# Patient Record
Sex: Female | Born: 1995 | Race: Black or African American | Hispanic: No | Marital: Single | Smoking: Never smoker
Health system: Southern US, Community
[De-identification: ages and names within clinical notes are randomized; demographics above are authoritative.]

## PROBLEM LIST (undated history)

## (undated) DIAGNOSIS — Z789 Other specified health status: Secondary | ICD-10-CM

## (undated) DIAGNOSIS — I10 Essential (primary) hypertension: Secondary | ICD-10-CM

## (undated) HISTORY — DX: Other specified health status: Z78.9

## (undated) HISTORY — PX: NO PAST SURGERIES: SHX2092

---

## 2018-10-12 ENCOUNTER — Ambulatory Visit (HOSPITAL_COMMUNITY)
Admission: EM | Admit: 2018-10-12 | Discharge: 2018-10-12 | Disposition: A | Discharge status: Home / Self Care | Payer: Managed Care, Other (non HMO) | Attending: Emergency Medicine | Admitting: Emergency Medicine

## 2018-10-12 ENCOUNTER — Encounter (HOSPITAL_COMMUNITY): Payer: Self-pay | Admitting: Emergency Medicine

## 2018-10-12 ENCOUNTER — Other Ambulatory Visit: Payer: Self-pay

## 2018-10-12 DIAGNOSIS — Z20822 Contact with and (suspected) exposure to covid-19: Secondary | ICD-10-CM

## 2018-10-12 DIAGNOSIS — Z20828 Contact with and (suspected) exposure to other viral communicable diseases: Secondary | ICD-10-CM | POA: Insufficient documentation

## 2018-10-12 DIAGNOSIS — R1110 Vomiting, unspecified: Secondary | ICD-10-CM | POA: Diagnosis not present

## 2018-10-12 DIAGNOSIS — R197 Diarrhea, unspecified: Secondary | ICD-10-CM

## 2018-10-12 DIAGNOSIS — J029 Acute pharyngitis, unspecified: Secondary | ICD-10-CM | POA: Insufficient documentation

## 2018-10-12 LAB — POCT RAPID STREP A: Streptococcus, Group A Screen (Direct): NEGATIVE

## 2018-10-12 NOTE — ED Provider Notes (Signed)
[facility]    CSN: [id number] Arrival date & time: 10/12/18  1150      History   Chief Complaint Chief Complaint  Patient presents with  . Diarrhea  . Emesis  . Sore Throat    HPI Veronica Townsend is a [age] y.o. female.   Patient presents with sore throat x1 week which she reports is getting better.  She reports intermittent vomiting and diarrhea x1 month.  She reports 4 episodes of diarrhea today.  She denies fever, chills, difficulty swallowing, cough, shortness of breath, rash, or other symptoms.  LMP: 10/04/2018.  The history is provided by the patient.    History reviewed. No pertinent past medical history.  There are no active problems to display for this patient.   History reviewed. No pertinent surgical history.  OB History   No obstetric history on file.      Home Medications    Prior to Admission medications   Not on File    Family History History reviewed. No pertinent family history.  Social History Social History   Tobacco Use  . Smoking status: Never Smoker  . Smokeless tobacco: Never Used  Substance Use Topics  . Alcohol use: Yes  . Drug use: Yes    Types: Marijuana     Allergies   Patient has no known allergies.   Review of Systems Review of Systems  Constitutional: Negative for chills and fever.  HENT: Positive for sore throat. Negative for ear pain.   Eyes: Negative for pain and visual disturbance.  Respiratory: Negative for cough and shortness of breath.   Cardiovascular: Negative for chest pain and palpitations.  Gastrointestinal: Positive for diarrhea and vomiting. Negative for abdominal pain.  Genitourinary: Negative for dysuria and hematuria.  Musculoskeletal: Negative for arthralgias and back pain.  Skin: Negative for color change and rash.  Neurological: Negative for seizures and syncope.  All other systems reviewed and are negative.    Physical Exam Triage Vital Signs ED Triage Vitals  Enc Vitals Group      BP      Pulse      Resp      Temp      Temp src      SpO2      Weight      Height      Head Circumference      Peak Flow      Pain Score      Pain Loc      Pain Edu?      Excl. in GC?    No data found.  Updated Vital Signs BP (!) 143/78 (BP Location: Left Arm)   Pulse (!) 52   Temp 98.8 F (37.1 C) (Oral)   Resp 12   LMP 10/04/2018 (Exact Date)   SpO2 100%   Visual Acuity Right Eye Distance:   Left Eye Distance:   Bilateral Distance:    Right Eye Near:   Left Eye Near:    Bilateral Near:     Physical Exam Vitals signs and nursing note reviewed.  Constitutional:      General: She is not in acute distress.    Appearance: She is well-developed.  HENT:     Head: Normocephalic and atraumatic.     Right Ear: Tympanic membrane normal.     Left Ear: Tympanic membrane normal.     Nose: Nose normal.     Mouth/Throat:     Mouth: Mucous membranes are moist.  Pharynx: Oropharynx is clear.  Eyes:     Conjunctiva/sclera: Conjunctivae normal.  Neck:     Musculoskeletal: Neck supple.  Cardiovascular:     Rate and Rhythm: Normal rate and regular rhythm.     Heart sounds: No murmur.  Pulmonary:     Effort: Pulmonary effort is normal. No respiratory distress.     Breath sounds: Normal breath sounds.  Abdominal:     General: Bowel sounds are normal.     Palpations: Abdomen is soft.     Tenderness: There is no abdominal tenderness. There is no right CVA tenderness, left CVA tenderness, guarding or rebound.  Skin:    General: Skin is warm and dry.     Findings: No rash.  Neurological:     Mental Status: She is alert.      UC Treatments / Results  Labs (all labs ordered are listed, but only abnormal results are displayed) Labs Reviewed  NOVEL CORONAVIRUS, NAA (HOSP ORDER, SEND-OUT TO REF LAB; TAT 18-24 HRS)  CULTURE, GROUP A STREP [facility])  POCT RAPID STREP A    EKG   Radiology No results found.  Procedures Procedures (including critical care time)   Medications Ordered in UC Medications - No data to display  Initial Impression / Assessment and Plan / UC Course  I have reviewed the triage vital signs and the nursing notes.  Pertinent labs & imaging results that were available during my care of the patient were reviewed by me and considered in my medical decision making (see chart for details).    Sore throat, suspected COVID.  Rapid strep negative; culture pending.  COVID test performed here.  Instructed patient to self quarantine until her test results are back.  Instructed patient to go to the emergency department if she develops difficulty swallowing, high fever, shortness of breath, severe diarrhea, or other concerning symptoms.  Patient agrees with plan of care.       Final Clinical Impressions(s) / UC Diagnoses   Final diagnoses:  Sore throat  Suspected Covid-19 Virus Infection     Discharge Instructions     Your rapid strep test is negative.  A throat culture is pending; we will call you if it is positive requiring treatment.    Your COVID test is pending.  You should self quarantine until your test result is back and is negative.    Go to the emergency department if you develop difficulty swallowing, shortness of breath, high fever, severe diarrhea, or other concerning symptoms.         ED Prescriptions    None     Controlled Substance Prescriptions [place] Controlled Substance Registry consulted? Not Applicable   Sharion Balloon, NP 10/12/18 1415

## 2018-10-12 NOTE — Discharge Instructions (Addendum)
Your rapid strep test is negative.  A throat culture is pending; we will call you if it is positive requiring treatment.    Your COVID test is pending.  You should self quarantine until your test result is back and is negative.    Go to the emergency department if you develop difficulty swallowing, shortness of breath, high fever, severe diarrhea, or other concerning symptoms.

## 2018-10-12 NOTE — ED Triage Notes (Addendum)
Pt reports vomiting/dry heaves once a week x1 month and diarrhea once a week x1 month.  She reports new onset tonsillar swelling and soreness that started on Monday. She states the swelling has gone down, but the throat is still sore. She denies any fever, cough, body aches, loss of taste or smell.  She states her eating habits are "weird", but she states she was on a Keto diet for a while and stopped and this is when all of this started occurring.  Pt also reports travel to [place], [place] in the beginning of August and [place] at the beginning of this month.

## 2018-10-13 LAB — NOVEL CORONAVIRUS, NAA (HOSP ORDER, SEND-OUT TO REF LAB; TAT 18-24 HRS): SARS-CoV-2, NAA: NOT DETECTED

## 2018-10-15 LAB — CULTURE, GROUP A STREP (THRC)

## 2019-02-10 ENCOUNTER — Other Ambulatory Visit: Payer: Self-pay

## 2019-02-10 ENCOUNTER — Encounter (HOSPITAL_COMMUNITY): Payer: Self-pay

## 2019-02-10 ENCOUNTER — Ambulatory Visit (HOSPITAL_COMMUNITY)
Admission: EM | Admit: 2019-02-10 | Discharge: 2019-02-10 | Disposition: A | Discharge status: Home / Self Care | Payer: Managed Care, Other (non HMO) | Attending: Family Medicine | Admitting: Family Medicine

## 2019-02-10 DIAGNOSIS — J039 Acute tonsillitis, unspecified: Secondary | ICD-10-CM | POA: Diagnosis present

## 2019-02-10 DIAGNOSIS — N3000 Acute cystitis without hematuria: Secondary | ICD-10-CM | POA: Diagnosis present

## 2019-02-10 LAB — POCT URINALYSIS DIP (DEVICE)
Bilirubin Urine: NEGATIVE
Glucose, UA: NEGATIVE mg/dL
Hgb urine dipstick: NEGATIVE
Ketones, ur: NEGATIVE mg/dL
Nitrite: NEGATIVE
Protein, ur: 30 mg/dL — AB
Specific Gravity, Urine: 1.025 (ref 1.005–1.030)
Urobilinogen, UA: 0.2 mg/dL (ref 0.0–1.0)
pH: 7 (ref 5.0–8.0)

## 2019-02-10 LAB — POCT RAPID STREP A: Streptococcus, Group A Screen (Direct): NEGATIVE

## 2019-02-10 LAB — POCT INFECTIOUS MONO SCREEN: Mono Screen: NEGATIVE

## 2019-02-10 MED ORDER — CEPHALEXIN 500 MG PO CAPS: 500.0000 mg | ORAL_CAPSULE | Freq: Two times a day (BID) | ORAL | 0 refills | Status: DC

## 2019-02-10 NOTE — Discharge Instructions (Signed)
Drink plenty of water Take antibiotic 2 times a day Call or return if worse instead of better

## 2019-02-10 NOTE — ED Provider Notes (Signed)
[facility]    CSN: [id number] Arrival date & time: 02/10/19  1347      History   Chief Complaint Chief Complaint  Patient presents with  . Appointment    2.00  . Urinary Tract Infection    HPI Veronica Townsend is a 24 y.o. female.   HPI   Patient is here with 2 medical problems First she states that she has had burning with urination for about a week.  She states that it hurts at the end of urination.  No vaginal discharge.  No exudate or rash.  Mild frequency.  No nocturia.  No incontinence.  No hematuria.  Denies need for STD testing.  No flank pain, fever chills, nausea Patient also states that she believes she has tonsillitis.  Hurts to swallow.  Mild headache. Does not think she has been exposed to Covid. declines Covid testing.  Patient states she has had recurring tonsillitis.  Usually does not have strep.  Pain with swallowing but is keeping down fluids well.  History reviewed. No pertinent past medical history.  There are no problems to display for this patient.   History reviewed. No pertinent surgical history.  OB History   No obstetric history on file.      Home Medications    Prior to Admission medications   Medication Sig Start Date End Date Taking? Authorizing Provider  cephALEXin (KEFLEX) 500 MG capsule Take 1 capsule (500 mg total) by mouth 2 (two) times daily. 02/10/19   Eustace Moore, MD    Family History Family History  Problem Relation Age of Onset  . Diabetes Mother   . Hypertension Mother   . Hypertension Father     Social History Social History   Tobacco Use  . Smoking status: Never Smoker  . Smokeless tobacco: Never Used  Substance Use Topics  . Alcohol use: Yes    Comment: weekly  . Drug use: Yes    Types: Marijuana     Allergies   Patient has no known allergies.   Review of Systems Review of Systems  Constitutional: Positive for appetite change. Negative for chills, fatigue and fever.  HENT:  Positive for sore throat and trouble swallowing. Negative for congestion.   Gastrointestinal: Negative for nausea and vomiting.  Genitourinary: Positive for dysuria and frequency. Negative for flank pain and vaginal discharge.  Musculoskeletal: Negative for back pain.  Skin: Negative for rash.  Neurological: Positive for headaches.     Physical Exam Triage Vital Signs ED Triage Vitals  Enc Vitals Group     BP 02/10/19 1359 (!) 150/87     Pulse Rate 02/10/19 1359 72     Resp 02/10/19 1359 15     Temp 02/10/19 1359 98.8 F (37.1 C)     Temp Source 02/10/19 1359 Oral     SpO2 02/10/19 1359 100 %     Weight --      Height --      Head Circumference --      Peak Flow --      Pain Score 02/10/19 1357 9     Pain Loc --      Pain Edu? --      Excl. in GC? --    No data found.  Updated Vital Signs BP (!) 150/87 (BP Location: Right Arm)   Pulse 72   Temp 98.8 F (37.1 C) (Oral)   Resp 15   SpO2 100%      Physical Exam Constitutional:  General: She is not in acute distress.    Appearance: She is well-developed and normal weight. She is not ill-appearing.  HENT:     Head: Normocephalic and atraumatic.     Right Ear: Tympanic membrane and ear canal normal.     Left Ear: Tympanic membrane and ear canal normal.     Nose: Nose normal. No congestion.     Mouth/Throat:     Mouth: Mucous membranes are moist.     Pharynx: Posterior oropharyngeal erythema present.     Comments: Large tonsils, red, with exudate Eyes:     Conjunctiva/sclera: Conjunctivae normal.     Pupils: Pupils are equal, round, and reactive to light.  Neck:     Comments: Anterior and posterior cervical nodes Cardiovascular:     Rate and Rhythm: Normal rate.  Pulmonary:     Effort: Pulmonary effort is normal. No respiratory distress.  Abdominal:     General: There is no distension.     Palpations: Abdomen is soft.  Musculoskeletal:        General: Normal range of motion.     Cervical back: Normal  range of motion.  Lymphadenopathy:     Cervical: Cervical adenopathy present.  Skin:    General: Skin is warm and dry.  Neurological:     Mental Status: She is alert. Mental status is at baseline.  Psychiatric:        Mood and Affect: Mood normal.        Behavior: Behavior normal.      UC Treatments / Results  Labs (all labs ordered are listed, but only abnormal results are displayed) Labs Reviewed  POCT URINALYSIS DIP (DEVICE) - Abnormal; Notable for the following components:      Result Value   Protein, ur 30 (*)    Leukocytes,Ua TRACE (*)    All other components within normal limits  CULTURE, GROUP A STREP [facility])  URINE CULTURE  POCT INFECTIOUS MONO SCREEN  POCT INFECTIOUS MONO SCREEN  POCT RAPID STREP A  Urine is marginally positive.  Will send for culture Strep test is negative.  Culture is sent Mono test is negative  EKG   Radiology No results found.  Procedures Procedures (including critical care time)  Medications Ordered in UC Medications - No data to display  Initial Impression / Assessment and Plan / UC Course  I have reviewed the triage vital signs and the nursing notes.  Pertinent labs & imaging results that were available during my care of the patient were reviewed by me and considered in my medical decision making (see chart for details).     Viral URI Possible UTI Tests pending Refuses COVID Final Clinical Impressions(s) / UC Diagnoses   Final diagnoses:  Acute cystitis without hematuria  Acute tonsillitis, unspecified etiology     Discharge Instructions     Drink plenty of water Take antibiotic 2 times a day Call or return if worse instead of better   ED Prescriptions    Medication Sig Dispense Auth. Provider   cephALEXin (KEFLEX) 500 MG capsule Take 1 capsule (500 mg total) by mouth 2 (two) times daily. 10 capsule Raylene Everts, MD     PDMP not reviewed this encounter.   Raylene Everts, MD 02/10/19 [phone]

## 2019-02-10 NOTE — ED Triage Notes (Signed)
Patient presents to Urgent Care with complaints of irritation during urination since about a week ago. Patient reports she also believes she has tonsillitis, states it hurts to swallow.

## 2019-02-11 LAB — URINE CULTURE: Culture: <10000 — AB

## 2019-02-13 LAB — CULTURE, GROUP A STREP (THRC)

## 2019-02-25 ENCOUNTER — Encounter (HOSPITAL_COMMUNITY): Payer: Self-pay | Admitting: Emergency Medicine

## 2019-02-25 ENCOUNTER — Other Ambulatory Visit: Payer: Self-pay

## 2019-02-25 ENCOUNTER — Ambulatory Visit (HOSPITAL_COMMUNITY)
Admission: EM | Admit: 2019-02-25 | Discharge: 2019-02-25 | Disposition: A | Discharge status: Home / Self Care | Payer: Managed Care, Other (non HMO) | Attending: Family Medicine | Admitting: Family Medicine

## 2019-02-25 DIAGNOSIS — N898 Other specified noninflammatory disorders of vagina: Secondary | ICD-10-CM | POA: Insufficient documentation

## 2019-02-25 DIAGNOSIS — Z202 Contact with and (suspected) exposure to infections with a predominantly sexual mode of transmission: Secondary | ICD-10-CM | POA: Diagnosis present

## 2019-02-25 MED ORDER — DOXYCYCLINE HYCLATE 100 MG PO CAPS: 100.0000 mg | ORAL_CAPSULE | Freq: Two times a day (BID) | ORAL | 0 refills | Status: DC

## 2019-02-25 MED ORDER — METRONIDAZOLE 500 MG PO TABS: 500.0000 mg | ORAL_TABLET | Freq: Two times a day (BID) | ORAL | 0 refills | Status: DC

## 2019-02-25 NOTE — Discharge Instructions (Signed)
Please pick up your prescription for doxycycline 100 mg and begin taking twice daily for the next seven (7) days. This will treat chlamydia. Your other prescription will treat trichomonas.  Even though we have treated you today, we have sent testing for sexually transmitted infections. We will notify you of any positive results once they are received. If required, we will prescribe any medications you might need.  Please refrain from all sexual activity for at least the next seven days.

## 2019-02-25 NOTE — ED Provider Notes (Signed)
[place]   [id number] 02/25/19 Arrival Time: 1093  ASSESSMENT & PLAN:  1. Vaginal discharge   2. Exposure to STD       Discharge Instructions     Please pick up your prescription for doxycycline 100 mg and begin taking twice daily for the next seven (7) days. This will treat chlamydia. Your other prescription will treat trichomonas.  Even though we have treated you today, we have sent testing for sexually transmitted infections. We will notify you of any positive results once they are received. If required, we will prescribe any medications you might need.  Please refrain from all sexual activity for at least the next seven days.     Pending: Labs Reviewed  CERVICOVAGINAL ANCILLARY ONLY   Would not like HIV/RPR testing. Will notify of any positive results. Instructed to refrain from sexual activity for at least seven days.  Reviewed expectations re: course of current medical issues. Questions answered. Outlined signs and symptoms indicating need for more acute intervention. Patient verbalized understanding. After Visit Summary given.   SUBJECTIVE:  Veronica Townsend is a 24 y.o. female who presents with complaint of vaginal discharge. Onset abrupt. First noticed few d ago. No odor. No specific aggravating or alleviating factors reported. Denies: urinary frequency, dysuria and gross hematuria. Afebrile. No abdominal or pelvic pain. Normal PO intake wihout n/v. No genital rashes or lesions. Reports that a sexual partner reports testing positive for chlamydia and trich. "Don't think I have anything though". OTC treatment: none. History of STI: none reported.  Patient's last menstrual period was 01/27/2019.    OBJECTIVE:  Vitals:   02/25/19 1237  BP: 138/79  Pulse: 78  Resp: 18  Temp: 98.7 F (37.1 C)  TempSrc: Oral  SpO2: 100%     General appearance: alert, cooperative, appears stated age and no distress Throat: lips, mucosa, and tongue normal;  teeth and gums normal CV: RRR Lungs: CTAB Back: no CVA tenderness; FROM at waist Abdomen: soft, non-tender GU: deferred Skin: warm and dry Psychological: alert and cooperative; normal mood and affect.    Labs Reviewed  CERVICOVAGINAL ANCILLARY ONLY    No Known Allergies  History reviewed. No pertinent past medical history. Family History  Problem Relation Age of Onset  . Diabetes Mother   . Hypertension Mother   . Hypertension Father    Social History   Socioeconomic History  . Marital status: Single    Spouse name: Not on file  . Number of children: Not on file  . Years of education: Not on file  . Highest education level: Not on file  Occupational History  . Not on file  Tobacco Use  . Smoking status: Never Smoker  . Smokeless tobacco: Never Used  Substance and Sexual Activity  . Alcohol use: Yes    Comment: weekly  . Drug use: Yes    Types: Marijuana  . Sexual activity: Not on file  Other Topics Concern  . Not on file  Social History Narrative  . Not on file   Social Determinants of Health   Financial Resource Strain:   . Difficulty of Paying Living Expenses: Not on file  Food Insecurity:   . Worried About Charity fundraiser in the Last Year: Not on file  . Ran Out of Food in the Last Year: Not on file  Transportation Needs:   . Lack of Transportation (Medical): Not on file  . Lack of Transportation (Non-Medical): Not on file  Physical Activity:   .  Days of Exercise per Week: Not on file  . Minutes of Exercise per Session: Not on file  Stress:   . Feeling of Stress : Not on file  Social Connections:   . Frequency of Communication with Friends and Family: Not on file  . Frequency of Social Gatherings with Friends and Family: Not on file  . Attends Religious Services: Not on file  . Active Member of Clubs or Organizations: Not on file  . Attends Banker Meetings: Not on file  . Marital Status: Not on file  Intimate Partner Violence:    . Fear of Current or Ex-Partner: Not on file  . Emotionally Abused: Not on file  . Physically Abused: Not on file  . Sexually Abused: Not on file          Mardella Layman, MD 02/25/19 1321

## 2019-02-25 NOTE — ED Triage Notes (Signed)
Sexual partner reports testing positive for trich and chlamydia. Patient uncertain if she is having discharge or is it related to starting period soon.  Low abdominal cramping

## 2019-02-27 LAB — CERVICOVAGINAL ANCILLARY ONLY
Bacterial vaginitis: POSITIVE — AB
Candida vaginitis: NEGATIVE
Chlamydia: NEGATIVE
Neisseria Gonorrhea: NEGATIVE
Trichomonas: NEGATIVE

## 2020-01-31 NOTE — L&D Delivery Note (Signed)
[facility] Delivery Note  Veronica Townsend is a 25 y.o. G1P0 s/p SVD at [redacted]w[redacted]d. She was admitted for IOL for cHTN (not on medications).   ROM: 12h 77m with clear fluid GBS Status: Positive Maximum Maternal Temperature: 99  Labor Progress: Presented for IOL, was closed and received 4 doses of cytotec, and then was started on pitocin and had AROM and progressed to complete.  Delivery Date/Time: [phone] at 01/16/2021 Delivery: Called to room and patient was complete and pushing. Head delivered LOA. No nuchal cord present. Once head was out the infant restituted however the anterior shoulder did not deliver and a shoulder dystocia was recognized. At that time McRoberts maneuver, suprapubic pressure was applied as well as the posterior arm was delivered by Dr. Ephriam Jenkins and the shoulder dystocia was resolved at 45 seconds. After that the body delivered in usual fashion. Infant was a bit stunned but with stimulation had spontaneous cry, placed on mother's abdomen, dried. Cord clamped x 2 after 1-minute delay, and cut by grandmother of baby. Cord blood drawn. Placenta delivered spontaneously with gentle cord traction. Due to prolonged induction with prolonged pitocin administration, patient was given TXA at delivery. Fundus was found to be boggy and she was given Methergine (blood pressure confirmed to be normotensive prior to administration) and a manual sweep was done, and fundus was found to be firming up. At that time Labia, perineum, vagina, and cervix inspected and found to have a first degree laceration with a left labial extension which was repaired with 3-0 vicryl. As well as the left labial extension was repaired with two interrupted stitched. The laceration was found to be hemostatic after repair.   Placenta: intact, 3V cord, to L&D Complications: 45 second shoulder dystocia Lacerations: 1st degree perineal, repaired for hemostasis purposes. EBL: 450cc Analgesia: epidural, local lidocaine for  part of repair.   Infant: female   APGARs 69,9   3220g  [name], MD, MPH OB Fellow, [facility], [facility] 01/16/2021, 10:24 AM

## 2020-05-12 DIAGNOSIS — Z3201 Encounter for pregnancy test, result positive: Secondary | ICD-10-CM | POA: Diagnosis not present

## 2020-05-25 ENCOUNTER — Inpatient Hospital Stay (HOSPITAL_COMMUNITY): Payer: Medicaid Other

## 2020-05-25 ENCOUNTER — Encounter (HOSPITAL_COMMUNITY): Payer: Self-pay

## 2020-05-25 ENCOUNTER — Encounter (HOSPITAL_COMMUNITY): Payer: Self-pay | Admitting: Obstetrics and Gynecology

## 2020-05-25 ENCOUNTER — Other Ambulatory Visit: Payer: Self-pay

## 2020-05-25 ENCOUNTER — Ambulatory Visit (HOSPITAL_COMMUNITY)
Admission: EM | Admit: 2020-05-25 | Discharge: 2020-05-25 | Disposition: A | Discharge status: Home / Self Care | Payer: Medicaid Other | Attending: Student | Admitting: Student

## 2020-05-25 ENCOUNTER — Inpatient Hospital Stay (HOSPITAL_COMMUNITY)
Admission: AD | Admit: 2020-05-25 | Discharge: 2020-05-25 | Disposition: A | Discharge status: Home / Self Care | Payer: Medicaid Other | Attending: Obstetrics and Gynecology | Admitting: Obstetrics and Gynecology

## 2020-05-25 DIAGNOSIS — O26851 Spotting complicating pregnancy, first trimester: Secondary | ICD-10-CM | POA: Diagnosis not present

## 2020-05-25 DIAGNOSIS — O26891 Other specified pregnancy related conditions, first trimester: Secondary | ICD-10-CM

## 2020-05-25 DIAGNOSIS — O23591 Infection of other part of genital tract in pregnancy, first trimester: Secondary | ICD-10-CM

## 2020-05-25 DIAGNOSIS — R070 Pain in throat: Secondary | ICD-10-CM | POA: Diagnosis not present

## 2020-05-25 DIAGNOSIS — Z3A08 8 weeks gestation of pregnancy: Secondary | ICD-10-CM | POA: Diagnosis not present

## 2020-05-25 DIAGNOSIS — J020 Streptococcal pharyngitis: Secondary | ICD-10-CM | POA: Diagnosis not present

## 2020-05-25 DIAGNOSIS — Z3A11 11 weeks gestation of pregnancy: Secondary | ICD-10-CM | POA: Diagnosis not present

## 2020-05-25 DIAGNOSIS — B9689 Other specified bacterial agents as the cause of diseases classified elsewhere: Secondary | ICD-10-CM | POA: Insufficient documentation

## 2020-05-25 DIAGNOSIS — N760 Acute vaginitis: Secondary | ICD-10-CM

## 2020-05-25 DIAGNOSIS — R221 Localized swelling, mass and lump, neck: Secondary | ICD-10-CM | POA: Diagnosis not present

## 2020-05-25 DIAGNOSIS — R109 Unspecified abdominal pain: Secondary | ICD-10-CM | POA: Insufficient documentation

## 2020-05-25 DIAGNOSIS — O23592 Infection of other part of genital tract in pregnancy, second trimester: Secondary | ICD-10-CM | POA: Insufficient documentation

## 2020-05-25 DIAGNOSIS — R0602 Shortness of breath: Secondary | ICD-10-CM | POA: Diagnosis not present

## 2020-05-25 DIAGNOSIS — O4691 Antepartum hemorrhage, unspecified, first trimester: Secondary | ICD-10-CM

## 2020-05-25 DIAGNOSIS — O4690 Antepartum hemorrhage, unspecified, unspecified trimester: Secondary | ICD-10-CM

## 2020-05-25 DIAGNOSIS — O26859 Spotting complicating pregnancy, unspecified trimester: Secondary | ICD-10-CM

## 2020-05-25 DIAGNOSIS — O26899 Other specified pregnancy related conditions, unspecified trimester: Secondary | ICD-10-CM

## 2020-05-25 LAB — POCT URINALYSIS DIPSTICK, ED / UC
Bilirubin Urine: NEGATIVE
Glucose, UA: NEGATIVE mg/dL
Hgb urine dipstick: NEGATIVE
Ketones, ur: NEGATIVE mg/dL
Nitrite: NEGATIVE
Protein, ur: NEGATIVE mg/dL
Specific Gravity, Urine: 1.025 (ref 1.005–1.030)
Urobilinogen, UA: 1 mg/dL (ref 0.0–1.0)
pH: 7 (ref 5.0–8.0)

## 2020-05-25 LAB — HIV ANTIBODY (ROUTINE TESTING W REFLEX): HIV Screen 4th Generation wRfx: NONREACTIVE

## 2020-05-25 LAB — ABO/RH: ABO/RH(D): AB POS

## 2020-05-25 LAB — CBC
HCT: 36.2 % (ref 36.0–46.0)
Hemoglobin: 11.6 g/dL — ABNORMAL LOW (ref 12.0–15.0)
MCH: 27 pg (ref 26.0–34.0)
MCHC: 32 g/dL (ref 30.0–36.0)
MCV: 84.2 fL (ref 80.0–100.0)
Platelets: 251 10*3/uL (ref 150–400)
RBC: 4.3 MIL/uL (ref 3.87–5.11)
RDW: 13 % (ref 11.5–15.5)
WBC: 16 10*3/uL — ABNORMAL HIGH (ref 4.0–10.5)
nRBC: 0 % (ref 0.0–0.2)

## 2020-05-25 LAB — HCG, QUANTITATIVE, PREGNANCY: hCG, Beta Chain, Quant, S: 5763 m[IU]/mL — ABNORMAL HIGH (ref <5)

## 2020-05-25 LAB — COMPREHENSIVE METABOLIC PANEL
ALT: 19 U/L (ref 0–44)
AST: 19 U/L (ref 15–41)
Albumin: 3.5 g/dL (ref 3.5–5.0)
Alkaline Phosphatase: 65 U/L (ref 38–126)
Anion gap: 4 — ABNORMAL LOW (ref 5–15)
BUN: 8 mg/dL (ref 6–20)
CO2: 27 mmol/L (ref 22–32)
Calcium: 8.9 mg/dL (ref 8.9–10.3)
Chloride: 104 mmol/L (ref 98–111)
Creatinine, Ser: 0.59 mg/dL (ref 0.44–1.00)
GFR, Estimated: >60 mL/min (ref >60)
Glucose, Bld: 88 mg/dL (ref 70–99)
Potassium: 3.9 mmol/L (ref 3.5–5.1)
Sodium: 135 mmol/L (ref 135–145)
Total Bilirubin: 0.5 mg/dL (ref 0.3–1.2)
Total Protein: 6.7 g/dL (ref 6.5–8.1)

## 2020-05-25 LAB — POCT PREGNANCY, URINE: Preg Test, Ur: POSITIVE — AB

## 2020-05-25 LAB — PROTEIN / CREATININE RATIO, URINE
Creatinine, Urine: 124.75 mg/dL
Protein Creatinine Ratio: 0.05 mg/mg{Cre} (ref 0.00–0.15)
Total Protein, Urine: 6 mg/dL

## 2020-05-25 LAB — WET PREP, GENITAL
Sperm: NONE SEEN
Trich, Wet Prep: NONE SEEN
Yeast Wet Prep HPF POC: NONE SEEN

## 2020-05-25 LAB — POCT RAPID STREP A, ED / UC: Streptococcus, Group A Screen (Direct): POSITIVE — AB

## 2020-05-25 MED ORDER — PROMETHAZINE HCL 12.5 MG PO TABS: 12.5000 mg | ORAL_TABLET | Freq: Four times a day (QID) | ORAL | 0 refills | Status: DC | PRN

## 2020-05-25 MED ORDER — METRONIDAZOLE 500 MG PO TABS: 500.0000 mg | ORAL_TABLET | Freq: Two times a day (BID) | ORAL | 0 refills | Status: DC

## 2020-05-25 MED ORDER — AMOXICILLIN 875 MG PO TABS: 875.0000 mg | ORAL_TABLET | Freq: Two times a day (BID) | ORAL | 0 refills | Status: AC

## 2020-05-25 NOTE — ED Triage Notes (Addendum)
Pt noticed swollen areas on both sides of neck since last night. These are sore and palpable by RN. Pt reports sensation of throat closing causing sensation of difficulty breathing (sat 97 at this time, remained 97-100% throughout triage) with sore throat. Reports also having body aches and fatigue.  Of note pt reports being [redacted] weeks pregnant. She adds that she had spotting 4 days ago and is experiencing lower abdominal cramping.   Reports falling down stairs 4 weeks ago. Denies injury.

## 2020-05-25 NOTE — ED Notes (Addendum)
Cheree Ditto, PA notified of pt's lower abdominal cramping in pregnancy as well as sensation of throat closing. States will go to see pt. Pt kept on pulse ox with door open.

## 2020-05-25 NOTE — MAU Provider Note (Addendum)
History     CSN: [id number]  Arrival date and time: 05/25/20 1244   Event Date/Time   First Provider Initiated Contact with Patient 05/25/20 1427      Chief Complaint  Patient presents with  . Abdominal Pain  . Possible Pregnancy   Ms. Veronica Townsend is a 25 y.o. year old G1P0 female at [redacted]w[redacted]d weeks gestation who presents to MAU reporting pinkish spotting off and on for 2 days; none today., fatigue, hips and legs "very sore," and pain/cramping in lower abdomen for 4 days. Her last unprotected SI was 3 weeks ago. She was seen at UC earlier today, dx'd with strep throat and Rx'd Amoxicillin 875 mg BID x 7 days. She was then "referred" to MAU for evaluation of abdominal pain pregnancy. She has not established OB care at this time, but "considering going to an OB on [employer]."    OB History    Gravida  1   Para      Term      Preterm      AB      Living        SAB      IAB      Ectopic      Multiple      Live Births              History reviewed. No pertinent past medical history.  History reviewed. No pertinent surgical history.  Family History  Problem Relation Age of Onset  . Diabetes Mother   . Hypertension Mother   . Hypertension Father     Social History   Tobacco Use  . Smoking status: Never Smoker  . Smokeless tobacco: Never Used  Vaping Use  . Vaping Use: Never used  Substance Use Topics  . Alcohol use: Not Currently    Comment: weekly  . Drug use: Not Currently    Types: Marijuana    Allergies: No Known Allergies  Medications Prior to Admission  Medication Sig Dispense Refill Last Dose  . amoxicillin (AMOXIL) 875 MG tablet Take 1 tablet (875 mg total) by mouth 2 (two) times daily for 7 days. 14 tablet 0     Review of Systems  Constitutional: Positive for fatigue.  HENT: Negative.   Eyes: Negative.   Respiratory: Negative.   Cardiovascular: Negative.   Gastrointestinal: Negative.   Endocrine: Negative.   Genitourinary:  Positive for pelvic pain (cramping) and vaginal bleeding (spotting).  Musculoskeletal: Negative.   Skin: Negative.   Allergic/Immunologic: Negative.   Neurological: Negative.   Hematological: Negative.   Psychiatric/Behavioral: Negative.    Physical Exam   Blood pressure (!) 150/81, pulse 76, temperature 98 F (36.7 C), temperature source Oral, resp. rate 18, height 5\' 10"  (1.778 m), weight 85.2 kg, last menstrual period 03/08/2020, SpO2 99 %.  Physical Exam Vitals and nursing note reviewed.  Constitutional:      Appearance: Normal appearance.  Genitourinary:    General: Normal vulva.     Comments: Pelvic exam: External genitalia normal, SE: vaginal walls pink and well rugated, cervix is smooth, pink, no lesions, moderate amt of thick, white vaginal d/c -- WP, GC/CT done, cervix visually closed, Uterus is non-tender, no CMT or friability, no adnexal tenderness.  Musculoskeletal:        General: Normal range of motion.  Skin:    General: Skin is warm and dry.  Neurological:     Mental Status: She is alert and oriented to person, place, and time.  Psychiatric:        Mood and Affect: Mood normal.        Behavior: Behavior normal.        Thought Content: Thought content normal.        Judgment: Judgment normal.     MAU Course  Procedures  MDM CCUA UPT CBC ABO/Rh HCG Wet Prep GC/CT -- pending HIV -- pending OB < 14 wks [place] with TV  Results for orders placed or performed during the hospital encounter of 05/25/20 (from the past 24 hour(s))  Pregnancy, urine POC     Status: Abnormal   Collection Time: 05/25/20  1:46 PM  Result Value Ref Range   Preg Test, Ur POSITIVE (A) NEGATIVE  Protein / creatinine ratio, urine     Status: None   Collection Time: 05/25/20  2:35 PM  Result Value Ref Range   Creatinine, Urine 124.75 mg/dL   Total Protein, Urine 6 mg/dL   Protein Creatinine Ratio 0.05 0.00 - 0.15 mg/mg[Cre]  Wet prep, genital     Status: Abnormal   Collection Time:  05/25/20  2:50 PM   Specimen: Vaginal  Result Value Ref Range   Yeast Wet Prep HPF POC NONE SEEN NONE SEEN   Trich, Wet Prep NONE SEEN NONE SEEN   Clue Cells Wet Prep HPF POC PRESENT (A) NONE SEEN   WBC, Wet Prep HPF POC MANY (A) NONE SEEN   Sperm NONE SEEN   ABO/Rh     Status: None   Collection Time: 05/25/20  3:03 PM  Result Value Ref Range   ABO/RH(D) AB POS    No rh immune globuloin      NOT A RH IMMUNE GLOBULIN CANDIDATE, PT RH POSITIVE Performed at [facility], [address], [place], [place] [address]   hCG, quantitative, pregnancy     Status: Abnormal   Collection Time: 05/25/20  3:03 PM  Result Value Ref Range   hCG, Beta Chain, Quant, S 5,763 (H) <5 mIU/mL  HIV Antibody (routine testing w rflx)     Status: None   Collection Time: 05/25/20  3:03 PM  Result Value Ref Range   HIV Screen 4th Generation wRfx Non Reactive Non Reactive  CBC     Status: Abnormal   Collection Time: 05/25/20  3:03 PM  Result Value Ref Range   WBC 16.0 (H) 4.0 - 10.5 K/uL   RBC 4.30 3.87 - 5.11 MIL/uL   Hemoglobin 11.6 (L) 12.0 - 15.0 g/dL   HCT 27.7 82.4 - 23.5 %   MCV 84.2 80.0 - 100.0 fL   MCH 27.0 26.0 - 34.0 pg   MCHC 32.0 30.0 - 36.0 g/dL   RDW 36.1 44.3 - 15.4 %   Platelets 251 150 - 400 K/uL   nRBC 0.0 0.0 - 0.2 %  Comprehensive metabolic panel     Status: Abnormal   Collection Time: 05/25/20  3:03 PM  Result Value Ref Range   Sodium 135 135 - 145 mmol/L   Potassium 3.9 3.5 - 5.1 mmol/L   Chloride 104 98 - 111 mmol/L   CO2 27 22 - 32 mmol/L   Glucose, Bld 88 70 - 99 mg/dL   BUN 8 6 - 20 mg/dL   Creatinine, Ser 0.08 0.44 - 1.00 mg/dL   Calcium 8.9 8.9 - 67.6 mg/dL   Total Protein 6.7 6.5 - 8.1 g/dL   Albumin 3.5 3.5 - 5.0 g/dL   AST 19 15 - 41 U/L  ALT 19 0 - 44 U/L   Alkaline Phosphatase 65 38 - 126 U/L   Total Bilirubin 0.5 0.3 - 1.2 mg/dL   GFR, Estimated >69 >48 mL/min   Anion gap 4 (L) 5 - 15    US OB LESS THAN 14 WEEKS WITH OB TRANSVAGINAL  Result Date:  05/25/2020 CLINICAL DATA:  Four days of vaginal spotting lower abdominal pain. Positive urine pregnancy test however no quantitative beta HCG performed at time of imaging. EXAM: OBSTETRIC <14 WK ULTRASOUND TECHNIQUE: Transabdominal ultrasound was performed for evaluation of the gestation as well as the maternal uterus and adnexal regions. COMPARISON:  None. FINDINGS: Intrauterine gestational sac: Single Yolk sac:  Visualized. Embryo:  Not Visualized. Cardiac Activity: Not Visualized. Heart Rate:  bpm MSD:  9 mm   5 w   4 d Subchorionic hemorrhage:  None visualized. Maternal uterus/adnexae: Small corpus luteum in the right ovary IMPRESSION: Single intrauterine pregnancy of uncertain viability, recommend follow-up ultrasound in 14 days.  Electronically Signed   By: Maudry Mayhew MD   On: 05/25/2020 16:30    Assessment and Plan  1. Spotting affecting pregnancy in first trimester  - US PELVIC COMPLETE WITH TRANSVAGINAL ordered for ~ 06/04/2020 for viability  2. Cramping affecting pregnancy, antepartum   3. Bacterial vaginosis - Rx for Flagyl 500 mg BID x 7 days - Information provided on BV - verbal explanation also given    4. [redacted] weeks gestation of pregnancy  - Discharge patient - Advised to call the OB provider of her choice and establish [facility] ASAP - Patient verbalized an understanding of the plan of care and agrees.   Raelyn Mora, CNM 05/25/2020, 2:28 PM

## 2020-05-25 NOTE — MAU Note (Signed)
4 days ago started having pinkish spotting, was off and on for 2 days, no longer seeing. Feeling fatigued. Hips and legs are very sore, for the last 2 wks. Pain in lower abd x4 days,cramping.  Was given rx at [facility] to treat strep throat

## 2020-05-25 NOTE — ED Provider Notes (Signed)
[facility]    CSN: [id number] Arrival date & time: 05/25/20  1109      History   Chief Complaint Chief Complaint  Patient presents with  . Neck swelling  . Oral Swelling  . Shortness of Breath  . Abdominal Pain  . throat tightness    HPI Veronica Townsend is a 25 y.o. female presenting with neck swelling, sore throat, shortness of breath, abdominal pain, vaginal spotting. She is [redacted] weeks pregnant.   Notes 1 day of sore throat, anterior cervical lymphadenopathy, sensation of throat closing, pain with swallowing, body aches, fatigue.  Also notes 4 days of intermittent lower abdominal cramping.  She had some light vaginal spotting for 2 days, though this seemed to resolve 2 days ago. Denies other urinary symptoms. Denies hematuria, dysuria, frequency, urgency, back pain, n/v/d. Denies changes in bowel or bladder function. Marland Kitchen   HPI  History reviewed. No pertinent past medical history.  There are no problems to display for this patient.   History reviewed. No pertinent surgical history.  OB History    Gravida  1   Para      Term      Preterm      AB      Living        SAB      IAB      Ectopic      Multiple      Live Births               Home Medications    Prior to Admission medications   Medication Sig Start Date End Date Taking? Authorizing Provider  amoxicillin (AMOXIL) 875 MG tablet Take 1 tablet (875 mg total) by mouth 2 (two) times daily for 7 days. 05/25/20 06/01/20 Yes Rhys Martini, PA-C    Family History Family History  Problem Relation Age of Onset  . Diabetes Mother   . Hypertension Mother   . Hypertension Father     Social History Social History   Tobacco Use  . Smoking status: Never Smoker  . Smokeless tobacco: Never Used  Vaping Use  . Vaping Use: Never used  Substance Use Topics  . Alcohol use: Not Currently    Comment: weekly  . Drug use: Not Currently    Types: Marijuana     Allergies   Patient has no  known allergies.   Review of Systems Review of Systems  Constitutional: Negative for appetite change, chills, diaphoresis, fever and unexpected weight change.  HENT: Positive for sore throat and trouble swallowing. Negative for congestion, ear pain, sinus pressure, sinus pain and sneezing.   Respiratory: Negative for cough, chest tightness and shortness of breath.   Cardiovascular: Negative for chest pain.  Gastrointestinal: Positive for abdominal pain. Negative for abdominal distention, anal bleeding, blood in stool, constipation, diarrhea, nausea, rectal pain and vomiting.  Genitourinary: Positive for vaginal bleeding. Negative for dysuria, flank pain, frequency, urgency, vaginal discharge and vaginal pain.  Musculoskeletal: Negative for back pain and myalgias.  Neurological: Negative for dizziness, light-headedness and headaches.  All other systems reviewed and are negative.    Physical Exam Triage Vital Signs ED Triage Vitals  Enc Vitals Group     BP 05/25/20 1206 135/78     Pulse Rate 05/25/20 1206 70     Resp 05/25/20 1206 18     Temp 05/25/20 1206 99.3 F (37.4 C)     Temp src --      SpO2 05/25/20 1206 99 %  Weight --      Height --      Head Circumference --      Peak Flow --      Pain Score 05/25/20 1202 6     Pain Loc --      Pain Edu? --      Excl. in GC? --    No data found.  Updated Vital Signs BP 135/78   Pulse 70   Temp 99.3 F (37.4 C)   Resp 18   LMP 03/08/2020 Comment: was spotting in march for 3 days  SpO2 99%   Visual Acuity Right Eye Distance:   Left Eye Distance:   Bilateral Distance:    Right Eye Near:   Left Eye Near:    Bilateral Near:     Physical Exam Vitals reviewed.  Constitutional:      General: She is not in acute distress.    Appearance: Normal appearance. She is not ill-appearing.  HENT:     Head: Normocephalic and atraumatic.     Right Ear: Hearing, tympanic membrane, ear canal and external ear normal. No swelling  or tenderness. There is no impacted cerumen. No mastoid tenderness. Tympanic membrane is not perforated, erythematous, retracted or bulging.     Left Ear: Hearing, tympanic membrane, ear canal and external ear normal. No swelling or tenderness. There is no impacted cerumen. No mastoid tenderness. Tympanic membrane is not perforated, erythematous, retracted or bulging.     Nose:     Right Sinus: No maxillary sinus tenderness or frontal sinus tenderness.     Left Sinus: No maxillary sinus tenderness or frontal sinus tenderness.     Mouth/Throat:     Mouth: Mucous membranes are moist.     Pharynx: Uvula midline. No oropharyngeal exudate or posterior oropharyngeal erythema.     Tonsils: Tonsillar exudate present. 2+ on the right. 2+ on the left.     Comments: Moist mucous membranes Smooth erythema posterior pharynx On exam she is tolerating her secretions without difficulty, there is no trismus, no drooling, she has normal phonation  Eyes:     Extraocular Movements: Extraocular movements intact.     Pupils: Pupils are equal, round, and reactive to light.  Cardiovascular:     Rate and Rhythm: Normal rate and regular rhythm.     Heart sounds: Normal heart sounds.  Pulmonary:     Effort: Pulmonary effort is normal.     Breath sounds: Normal breath sounds and air entry. No decreased breath sounds, wheezing, rhonchi or rales.  Chest:     Chest wall: No tenderness.  Abdominal:     General: Abdomen is flat. Bowel sounds are normal. There is no distension.     Palpations: Abdomen is soft. There is no mass.     Tenderness: There is abdominal tenderness in the right lower quadrant and left lower quadrant. There is no right CVA tenderness, left CVA tenderness, guarding or rebound. Negative signs include Murphy's sign, Rovsing's sign and McBurney's sign.  Genitourinary:    Comments: deferred Lymphadenopathy:     Cervical: Cervical adenopathy present.     Right cervical: Superficial cervical  adenopathy present.     Left cervical: Superficial cervical adenopathy present.  Skin:    General: Skin is warm.     Capillary Refill: Capillary refill takes less than 2 seconds.     Comments: Good skin turgor  Neurological:     General: No focal deficit present.     Mental Status: She is  alert and oriented to person, place, and time.  Psychiatric:        Attention and Perception: Attention and perception normal.        Mood and Affect: Mood and affect normal.        Behavior: Behavior normal. Behavior is cooperative.        Thought Content: Thought content normal.        Judgment: Judgment normal.      UC Treatments / Results  Labs (all labs ordered are listed, but only abnormal results are displayed) Labs Reviewed  POCT RAPID STREP A, ED / UC - Abnormal; Notable for the following components:      Result Value   Streptococcus, Group A Screen (Direct) POSITIVE (*)    All other components within normal limits  POCT URINALYSIS DIPSTICK, ED / UC - Abnormal; Notable for the following components:   Leukocytes,Ua SMALL (*)    All other components within normal limits  POC URINE PREG, ED    EKG   Radiology No results found.  Procedures Procedures (including critical care time)  Medications Ordered in UC Medications - No data to display  Initial Impression / Assessment and Plan / UC Course  I have reviewed the triage vital signs and the nursing notes.  Pertinent labs & imaging results that were available during my care of the patient were reviewed by me and considered in my medical decision making (see chart for details).     This patient is a 24 year old female presenting with strep pharyngitis and vaginal bleeding with some abdominal cramping.  She is [redacted] weeks pregnant. Rapid strep positive.  On exam, there is some mild tenderness to palpation in both the right and left lower quadrants.   Abdominal pain and vaginal spotting during pregnancy can indicate a miscarriage,  which is a medical emergency.  I am referring this patient to [facility] for further evaluation and management. she has not followed by OB/GYN yet.  She is in agreement with this plan, and states she will head straight there.  Hemodynamically stable for transfer to personal close time.  Final Clinical Impressions(s) / UC Diagnoses   Final diagnoses:  Vaginal bleeding in pregnancy  Streptococcal pharyngitis     Discharge Instructions     -Head straight to [facility] for further evaluation and management of abdominal pain with vaginal bleeding during pregnancy. -Amoxicillin twice daily for 7 days for your strep throat.    ED Prescriptions    Medication Sig Dispense Auth. Provider   amoxicillin (AMOXIL) 875 MG tablet Take 1 tablet (875 mg total) by mouth 2 (two) times daily for 7 days. 14 tablet Rhys Martini, PA-C     PDMP not reviewed this encounter.   Rhys Martini, PA-C 05/25/20 1351

## 2020-05-25 NOTE — Discharge Instructions (Addendum)
-  Head straight to [facility] for further evaluation and management of abdominal pain with vaginal bleeding during pregnancy. -Amoxicillin twice daily for 7 days for your strep throat.

## 2020-05-25 NOTE — Discharge Instructions (Signed)

## 2020-05-25 NOTE — ED Notes (Signed)
Patient is being discharged from the Urgent Care and sent to the MAU via POV . Per Ignacia Bayley, PA patient is in need of higher level of care due to lower abdominal cramping with positive pregnancy test. Patient is aware and verbalizes understanding of plan of care.  Vitals:   05/25/20 1206  BP: 135/78  Pulse: 70  Resp: 18  Temp: 99.3 F (37.4 C)  SpO2: 99%

## 2020-05-26 LAB — GC/CHLAMYDIA PROBE AMP (~~LOC~~) NOT AT ARMC
Chlamydia: POSITIVE — AB
Comment: NEGATIVE
Comment: NORMAL
Neisseria Gonorrhea: NEGATIVE

## 2020-06-09 ENCOUNTER — Other Ambulatory Visit: Payer: Self-pay

## 2020-06-09 ENCOUNTER — Telehealth: Payer: Self-pay | Admitting: Obstetrics and Gynecology

## 2020-06-09 ENCOUNTER — Ambulatory Visit (INDEPENDENT_AMBULATORY_CARE_PROVIDER_SITE_OTHER): Payer: Medicaid Other

## 2020-06-09 DIAGNOSIS — Z1331 Encounter for screening for depression: Secondary | ICD-10-CM

## 2020-06-09 MED ORDER — AZITHROMYCIN 250 MG PO TABS: 1000.0000 mg | ORAL_TABLET | Freq: Once | ORAL | 0 refills | Status: AC

## 2020-06-09 NOTE — Progress Notes (Signed)
Pt reports that she is hear today for tx.  Looking at pt's chart pt tested positive for chlamydia.  Pt reports that she is so confused about what she needs to do.  I explained to the patient that treatment of Azithromycin 1000 mg once has been sent to pharmacy and to make sure that her partner is treated as well.  I also advised to not have sex for two weeks after they both have been treated.  I explained to the pt that she will get tested again to make sure that it has been treated.  I informed pt that she has a NEW OB appt scheduled with [place] on June 7th so she can call us with concerns and questions.  Pt verbalized understanding with no further questions.   Leonette Nutting  06/09/20

## 2020-06-09 NOTE — Telephone Encounter (Signed)
Patient identification verified by DOB and address Notified of (+) CT results - patient aware of results and was "seen today at [facility] for it and just picked up Rx"  Raelyn Mora, [facility]  06/09/2020 6:54 PM

## 2020-07-06 ENCOUNTER — Encounter: Payer: Self-pay | Admitting: Family Medicine

## 2020-07-06 ENCOUNTER — Other Ambulatory Visit (HOSPITAL_COMMUNITY)
Admission: RE | Admit: 2020-07-06 | Discharge: 2020-07-06 | Disposition: A | Discharge status: Home / Self Care | Payer: PRIVATE HEALTH INSURANCE | Source: Ambulatory Visit | Attending: Family Medicine | Admitting: Family Medicine

## 2020-07-06 ENCOUNTER — Ambulatory Visit (INDEPENDENT_AMBULATORY_CARE_PROVIDER_SITE_OTHER): Payer: PRIVATE HEALTH INSURANCE | Admitting: Family Medicine

## 2020-07-06 ENCOUNTER — Other Ambulatory Visit: Payer: Self-pay

## 2020-07-06 VITALS — BP 132/63 | HR 80 | Wt 195.0 lb

## 2020-07-06 DIAGNOSIS — N898 Other specified noninflammatory disorders of vagina: Secondary | ICD-10-CM

## 2020-07-06 DIAGNOSIS — Z3481 Encounter for supervision of other normal pregnancy, first trimester: Secondary | ICD-10-CM | POA: Diagnosis not present

## 2020-07-06 DIAGNOSIS — Z124 Encounter for screening for malignant neoplasm of cervix: Secondary | ICD-10-CM | POA: Diagnosis not present

## 2020-07-06 DIAGNOSIS — Z3401 Encounter for supervision of normal first pregnancy, first trimester: Secondary | ICD-10-CM

## 2020-07-06 DIAGNOSIS — Z3400 Encounter for supervision of normal first pregnancy, unspecified trimester: Secondary | ICD-10-CM | POA: Insufficient documentation

## 2020-07-06 DIAGNOSIS — R030 Elevated blood-pressure reading, without diagnosis of hypertension: Secondary | ICD-10-CM

## 2020-07-06 DIAGNOSIS — O10919 Unspecified pre-existing hypertension complicating pregnancy, unspecified trimester: Secondary | ICD-10-CM | POA: Insufficient documentation

## 2020-07-06 DIAGNOSIS — O0990 Supervision of high risk pregnancy, unspecified, unspecified trimester: Secondary | ICD-10-CM | POA: Insufficient documentation

## 2020-07-06 MED ORDER — BLOOD PRESSURE KIT DEVI
1.0000 | 0 refills | Status: AC | PRN
Start: 2020-07-06 — End: ?

## 2020-07-06 NOTE — Patient Instructions (Signed)
 Second Trimester of Pregnancy  The second trimester of pregnancy is from week 13 through week 27. This is months 4 through 6 of pregnancy. The second trimester is often a time when you feel your best. Your body has adjusted to being pregnant, and you begin to feel better physically. During the second trimester:  Morning sickness has lessened or stopped completely.  You may have more energy.  You may have an increase in appetite. The second trimester is also a time when the unborn baby (fetus) is growing rapidly. At the end of the sixth month, the fetus may be up to 12 inches long and weigh about 1 pounds. You will likely begin to feel the baby move (quickening) between 16 and 20 weeks of pregnancy. Body changes during your second trimester Your body continues to go through many changes during your second trimester. The changes vary and generally return to normal after the baby is born. Physical changes  Your weight will continue to increase. You will notice your lower abdomen bulging out.  You may begin to get stretch marks on your hips, abdomen, and breasts.  Your breasts will continue to grow and to become tender.  Dark spots or blotches (chloasma or mask of pregnancy) may develop on your face.  A dark line from your belly button to the pubic area (linea nigra) may appear.  You may have changes in your hair. These can include thickening of your hair, rapid growth, and changes in texture. Some people also have hair loss during or after pregnancy, or hair that feels dry or thin. Health changes  You may develop headaches.  You may have heartburn.  You may develop constipation.  You may develop hemorrhoids or swollen, bulging veins (varicose veins).  Your gums may bleed and may be sensitive to brushing and flossing.  You may urinate more often because the fetus is pressing on your bladder.  You may have back pain. This is caused by: ? Weight gain. ? Pregnancy hormones  that are relaxing the joints in your pelvis. ? A shift in weight and the muscles that support your balance. Follow these instructions at home: Medicines  Follow your health care provider's instructions regarding medicine use. Specific medicines may be either safe or unsafe to take during pregnancy. Do not take any medicines unless approved by your health care provider.  Take a prenatal vitamin that contains at least 600 micrograms (mcg) of folic acid. Eating and drinking  Eat a healthy diet that includes fresh fruits and vegetables, whole grains, good sources of protein such as meat, eggs, or tofu, and low-fat dairy products.  Avoid raw meat and unpasteurized juice, milk, and cheese. These carry germs that can harm you and your baby.  You may need to take these actions to prevent or treat constipation: ? Drink enough fluid to keep your urine pale yellow. ? Eat foods that are high in fiber, such as beans, whole grains, and fresh fruits and vegetables. ? Limit foods that are high in fat and processed sugars, such as fried or sweet foods. Activity  Exercise only as directed by your health care provider. Most people can continue their usual exercise routine during pregnancy. Try to exercise for 30 minutes at least 5 days a week. Stop exercising if you develop contractions in your uterus.  Stop exercising if you develop pain or cramping in the lower abdomen or lower back.  Avoid exercising if it is very hot or humid or if you are   at a high altitude.  Avoid heavy lifting.  If you choose to, you may have sex unless your health care provider tells you not to. Relieving pain and discomfort  Wear a supportive bra to prevent discomfort from breast tenderness.  Take warm sitz baths to soothe any pain or discomfort caused by hemorrhoids. Use hemorrhoid cream if your health care provider approves.  Rest with your legs raised (elevated) if you have leg cramps or low back pain.  If you develop  varicose veins: ? Wear support hose as told by your health care provider. ? Elevate your feet for 15 minutes, 3-4 times a day. ? Limit salt in your diet. Safety  Wear your seat belt at all times when driving or riding in a car.  Talk with your health care provider if someone is verbally or physically abusive to you. Lifestyle  Do not use hot tubs, steam rooms, or saunas.  Do not douche. Do not use tampons or scented sanitary pads.  Avoid cat litter boxes and soil used by cats. These carry germs that can cause birth defects in the baby and possibly loss of the fetus by miscarriage or stillbirth.  Do not use herbal remedies, alcohol, illegal drugs, or medicines that are not approved by your health care provider. Chemicals in these products can harm your baby.  Do not use any products that contain nicotine or tobacco, such as cigarettes, e-cigarettes, and chewing tobacco. If you need help quitting, ask your health care provider. General instructions  During a routine prenatal visit, your health care provider will do a physical exam and other tests. He or she will also discuss your overall health. Keep all follow-up visits. This is important.  Ask your health care provider for a referral to a local prenatal education class.  Ask for help if you have counseling or nutritional needs during pregnancy. Your health care provider can offer advice or refer you to specialists for help with various needs. Where to find more information  American Pregnancy Association: americanpregnancy.org  American College of Obstetricians and Gynecologists: acog.org/en/Womens%20Health/Pregnancy  Office on Women's Health: womenshealth.gov/pregnancy Contact a health care provider if you have:  A headache that does not go away when you take medicine.  Vision changes or you see spots in front of your eyes.  Mild pelvic cramps, pelvic pressure, or nagging pain in the abdominal area.  Persistent nausea,  vomiting, or diarrhea.  A bad-smelling vaginal discharge or foul-smelling urine.  Pain when you urinate.  Sudden or extreme swelling of your face, hands, ankles, feet, or legs.  A fever. Get help right away if you:  Have fluid leaking from your vagina.  Have spotting or bleeding from your vagina.  Have severe abdominal cramping or pain.  Have difficulty breathing.  Have chest pain.  Have fainting spells.  Have not felt your baby move for the time period told by your health care provider.  Have new or increased pain, swelling, or redness in an arm or leg. Summary  The second trimester of pregnancy is from week 13 through week 27 (months 4 through 6).  Do not use herbal remedies, alcohol, illegal drugs, or medicines that are not approved by your health care provider. Chemicals in these products can harm your baby.  Exercise only as directed by your health care provider. Most people can continue their usual exercise routine during pregnancy.  Keep all follow-up visits. This is important. This information is not intended to replace advice given to you by   your health care provider. Make sure you discuss any questions you have with your health care provider. Document Revised: 06/25/2019 Document Reviewed: 05/01/2019 Elsevier Patient Education  2021 [employer]   Contraception Choices Contraception, also called birth control, refers to methods or devices that prevent pregnancy. Hormonal methods Contraceptive implant A contraceptive implant is a thin, plastic tube that contains a hormone that prevents pregnancy. It is different from an intrauterine device (IUD). It is inserted into the upper part of the arm by a health care provider. Implants can be effective for up to 3 years. Progestin-only injections Progestin-only injections are injections of progestin, a synthetic form of the hormone progesterone. They are given every 3 months by a health care provider. Birth control  pills Birth control pills are pills that contain hormones that prevent pregnancy. They must be taken once a day, preferably at the same time each day. A prescription is needed to use this method of contraception. Birth control patch The birth control patch contains hormones that prevent pregnancy. It is placed on the skin and must be changed once a week for three weeks and removed on the fourth week. A prescription is needed to use this method of contraception. Vaginal ring A vaginal ring contains hormones that prevent pregnancy. It is placed in the vagina for three weeks and removed on the fourth week. After that, the process is repeated with a new ring. A prescription is needed to use this method of contraception. Emergency contraceptive Emergency contraceptives prevent pregnancy after unprotected sex. They come in pill form and can be taken up to 5 days after sex. They work best the sooner they are taken after having sex. Most emergency contraceptives are available without a prescription. This method should not be used as your only form of birth control.   Barrier methods Female condom A female condom is a thin sheath that is worn over the penis during sex. Condoms keep sperm from going inside a woman's body. They can be used with a sperm-killing substance (spermicide) to increase their effectiveness. They should be thrown away after one use. Female condom A female condom is a soft, loose-fitting sheath that is put into the vagina before sex. The condom keeps sperm from going inside a woman's body. They should be thrown away after one use. Diaphragm A diaphragm is a soft, dome-shaped barrier. It is inserted into the vagina before sex, along with a spermicide. The diaphragm blocks sperm from entering the uterus, and the spermicide kills sperm. A diaphragm should be left in the vagina for 6-8 hours after sex and removed within 24 hours. A diaphragm is prescribed and fitted by a health care provider. A  diaphragm should be replaced every 1-2 years, after giving birth, after gaining more than 15 lb (6.8 kg), and after pelvic surgery. Cervical cap A cervical cap is a round, soft latex or plastic cup that fits over the cervix. It is inserted into the vagina before sex, along with spermicide. It blocks sperm from entering the uterus. The cap should be left in place for 6-8 hours after sex and removed within 48 hours. A cervical cap must be prescribed and fitted by a health care provider. It should be replaced every 2 years. Sponge A sponge is a soft, circular piece of polyurethane foam with spermicide in it. The sponge helps block sperm from entering the uterus, and the spermicide kills sperm. To use it, you make it wet and then insert it into the vagina. It should be   inserted before sex, left in for at least 6 hours after sex, and removed and thrown away within 30 hours. Spermicides Spermicides are chemicals that kill or block sperm from entering the cervix and uterus. They can come as a cream, jelly, suppository, foam, or tablet. A spermicide should be inserted into the vagina with an applicator at least 10-15 minutes before sex to allow time for it to work. The process must be repeated every time you have sex. Spermicides do not require a prescription.   Intrauterine contraception Intrauterine device (IUD) An IUD is a T-shaped device that is put in a woman's uterus. There are two types:  Hormone IUD.This type contains progestin, a synthetic form of the hormone progesterone. This type can stay in place for 3-5 years.  Copper IUD.This type is wrapped in copper wire. It can stay in place for 10 years. Permanent methods of contraception Female tubal ligation In this method, a woman's fallopian tubes are sealed, tied, or blocked during surgery to prevent eggs from traveling to the uterus. Hysteroscopic sterilization In this method, a small, flexible insert is placed into each fallopian tube. The inserts  cause scar tissue to form in the fallopian tubes and block them, so sperm cannot reach an egg. The procedure takes about 3 months to be effective. Another form of birth control must be used during those 3 months. Female sterilization This is a procedure to tie off the tubes that carry sperm (vasectomy). After the procedure, the man can still ejaculate fluid (semen). Another form of birth control must be used for 3 months after the procedure. Natural planning methods Natural family planning In this method, a couple does not have sex on days when the woman could become pregnant. Calendar method In this method, the woman keeps track of the length of each menstrual cycle, identifies the days when pregnancy can happen, and does not have sex on those days. Ovulation method In this method, a couple avoids sex during ovulation. Symptothermal method This method involves not having sex during ovulation. The woman typically checks for ovulation by watching changes in her temperature and in the consistency of cervical mucus. Post-ovulation method In this method, a couple waits to have sex until after ovulation. Where to find more information  Centers for Disease Control and Prevention: www.cdc.gov Summary  Contraception, also called birth control, refers to methods or devices that prevent pregnancy.  Hormonal methods of contraception include implants, injections, pills, patches, vaginal rings, and emergency contraceptives.  Barrier methods of contraception can include female condoms, female condoms, diaphragms, cervical caps, sponges, and spermicides.  There are two types of IUDs (intrauterine devices). An IUD can be put in a woman's uterus to prevent pregnancy for 3-5 years.  Permanent sterilization can be done through a procedure for males and females. Natural family planning methods involve nothaving sex on days when the woman could become pregnant. This information is not intended to replace advice  given to you by your health care provider. Make sure you discuss any questions you have with your health care provider. Document Revised: 06/23/2019 Document Reviewed: 06/23/2019 Elsevier Patient Education  2021 [employer] Inc.   Breastfeeding  Choosing to breastfeed is one of the best decisions you can make for yourself and your baby. A change in hormones during pregnancy causes your breasts to make breast milk in your milk-producing glands. Hormones prevent breast milk from being released before your baby is born. They also prompt milk flow after birth. Once breastfeeding has begun, thoughts of   your baby, as well as his or her sucking or crying, can stimulate the release of milk from your milk-producing glands. Benefits of breastfeeding Research shows that breastfeeding offers many health benefits for infants and mothers. It also offers a cost-free and convenient way to feed your baby. For your baby  Your first milk (colostrum) helps your baby's digestive system to function better.  Special cells in your milk (antibodies) help your baby to fight off infections.  Breastfed babies are less likely to develop asthma, allergies, obesity, or type 2 diabetes. They are also at lower risk for sudden infant death syndrome (SIDS).  Nutrients in breast milk are better able to meet your baby's needs compared to infant formula.  Breast milk improves your baby's brain development. For you  Breastfeeding helps to create a very special bond between you and your baby.  Breastfeeding is convenient. Breast milk costs nothing and is always available at the correct temperature.  Breastfeeding helps to burn calories. It helps you to lose the weight that you gained during pregnancy.  Breastfeeding makes your uterus return faster to its size before pregnancy. It also slows bleeding (lochia) after you give birth.  Breastfeeding helps to lower your risk of developing type 2 diabetes, osteoporosis, rheumatoid  arthritis, cardiovascular disease, and breast, ovarian, uterine, and endometrial cancer later in life. Breastfeeding basics Starting breastfeeding  Find a comfortable place to sit or lie down, with your neck and back well-supported.  Place a pillow or a rolled-up blanket under your baby to bring him or her to the level of your breast (if you are seated). Nursing pillows are specially designed to help support your arms and your baby while you breastfeed.  Make sure that your baby's tummy (abdomen) is facing your abdomen.  Gently massage your breast. With your fingertips, massage from the outer edges of your breast inward toward the nipple. This encourages milk flow. If your milk flows slowly, you may need to continue this action during the feeding.  Support your breast with 4 fingers underneath and your thumb above your nipple (make the letter "C" with your hand). Make sure your fingers are well away from your nipple and your baby's mouth.  Stroke your baby's lips gently with your finger or nipple.  When your baby's mouth is open wide enough, quickly bring your baby to your breast, placing your entire nipple and as much of the areola as possible into your baby's mouth. The areola is the colored area around your nipple. ? More areola should be visible above your baby's upper lip than below the lower lip. ? Your baby's lips should be opened and extended outward (flanged) to ensure an adequate, comfortable latch. ? Your baby's tongue should be between his or her lower gum and your breast.  Make sure that your baby's mouth is correctly positioned around your nipple (latched). Your baby's lips should create a seal on your breast and be turned out (everted).  It is common for your baby to suck about 2-3 minutes in order to start the flow of breast milk. Latching Teaching your baby how to latch onto your breast properly is very important. An improper latch can cause nipple pain, decreased milk  supply, and poor weight gain in your baby. Also, if your baby is not latched onto your nipple properly, he or she may swallow some air during feeding. This can make your baby fussy. Burping your baby when you switch breasts during the feeding can help to get rid   of the air. However, teaching your baby to latch on properly is still the best way to prevent fussiness from swallowing air while breastfeeding. Signs that your baby has successfully latched onto your nipple  Silent tugging or silent sucking, without causing you pain. Infant's lips should be extended outward (flanged).  Swallowing heard between every 3-4 sucks once your milk has started to flow (after your let-down milk reflex occurs).  Muscle movement above and in front of his or her ears while sucking. Signs that your baby has not successfully latched onto your nipple  Sucking sounds or smacking sounds from your baby while breastfeeding.  Nipple pain. If you think your baby has not latched on correctly, slip your finger into the corner of your baby's mouth to break the suction and place it between your baby's gums. Attempt to start breastfeeding again. Signs of successful breastfeeding Signs from your baby  Your baby will gradually decrease the number of sucks or will completely stop sucking.  Your baby will fall asleep.  Your baby's body will relax.  Your baby will retain a small amount of milk in his or her mouth.  Your baby will let go of your breast by himself or herself. Signs from you  Breasts that have increased in firmness, weight, and size 1-3 hours after feeding.  Breasts that are softer immediately after breastfeeding.  Increased milk volume, as well as a change in milk consistency and color by the fifth day of breastfeeding.  Nipples that are not sore, cracked, or bleeding. Signs that your baby is getting enough milk  Wetting at least 1-2 diapers during the first 24 hours after birth.  Wetting at least 5-6  diapers every 24 hours for the first week after birth. The urine should be clear or pale yellow by the age of 5 days.  Wetting 6-8 diapers every 24 hours as your baby continues to grow and develop.  At least 3 stools in a 24-hour period by the age of 5 days. The stool should be soft and yellow.  At least 3 stools in a 24-hour period by the age of 7 days. The stool should be seedy and yellow.  No loss of weight greater than 10% of birth weight during the first 3 days of life.  Average weight gain of 4-7 oz (113-198 g) per week after the age of 4 days.  Consistent daily weight gain by the age of 5 days, without weight loss after the age of 2 weeks. After a feeding, your baby may spit up a small amount of milk. This is normal. Breastfeeding frequency and duration Frequent feeding will help you make more milk and can prevent sore nipples and extremely full breasts (breast engorgement). Breastfeed when you feel the need to reduce the fullness of your breasts or when your baby shows signs of hunger. This is called "breastfeeding on demand." Signs that your baby is hungry include:  Increased alertness, activity, or restlessness.  Movement of the head from side to side.  Opening of the mouth when the corner of the mouth or cheek is stroked (rooting).  Increased sucking sounds, smacking lips, cooing, sighing, or squeaking.  Hand-to-mouth movements and sucking on fingers or hands.  Fussing or crying. Avoid introducing a pacifier to your baby in the first 4-6 weeks after your baby is born. After this time, you may choose to use a pacifier. Research has shown that pacifier use during the first year of a baby's life decreases the risk of   sudden infant death syndrome (SIDS). Allow your baby to feed on each breast as long as he or she wants. When your baby unlatches or falls asleep while feeding from the first breast, offer the second breast. Because newborns are often sleepy in the first few weeks of  life, you may need to awaken your baby to get him or her to feed. Breastfeeding times will vary from baby to baby. However, the following rules can serve as a guide to help you make sure that your baby is properly fed:  Newborns (babies 4 weeks of age or younger) may breastfeed every 1-3 hours.  Newborns should not go without breastfeeding for longer than 3 hours during the day or 5 hours during the night.  You should breastfeed your baby a minimum of 8 times in a 24-hour period. Breast milk pumping Pumping and storing breast milk allows you to make sure that your baby is exclusively fed your breast milk, even at times when you are unable to breastfeed. This is especially important if you go back to work while you are still breastfeeding, or if you are not able to be present during feedings. Your lactation consultant can help you find a method of pumping that works best for you and give you guidelines about how long it is safe to store breast milk.      Caring for your breasts while you breastfeed Nipples can become dry, cracked, and sore while breastfeeding. The following recommendations can help keep your breasts moisturized and healthy:  Avoid using soap on your nipples.  Wear a supportive bra designed especially for nursing. Avoid wearing underwire-style bras or extremely tight bras (sports bras).  Air-dry your nipples for 3-4 minutes after each feeding.  Use only cotton bra pads to absorb leaked breast milk. Leaking of breast milk between feedings is normal.  Use lanolin on your nipples after breastfeeding. Lanolin helps to maintain your skin's normal moisture barrier. Pure lanolin is not harmful (not toxic) to your baby. You may also hand express a few drops of breast milk and gently massage that milk into your nipples and allow the milk to air-dry. In the first few weeks after giving birth, some women experience breast engorgement. Engorgement can make your breasts feel heavy, warm,  and tender to the touch. Engorgement peaks within 3-5 days after you give birth. The following recommendations can help to ease engorgement:  Completely empty your breasts while breastfeeding or pumping. You may want to start by applying warm, moist heat (in the shower or with warm, water-soaked hand towels) just before feeding or pumping. This increases circulation and helps the milk flow. If your baby does not completely empty your breasts while breastfeeding, pump any extra milk after he or she is finished.  Apply ice packs to your breasts immediately after breastfeeding or pumping, unless this is too uncomfortable for you. To do this: ? Put ice in a plastic bag. ? Place a towel between your skin and the bag. ? Leave the ice on for 20 minutes, 2-3 times a day.  Make sure that your baby is latched on and positioned properly while breastfeeding. If engorgement persists after 48 hours of following these recommendations, contact your health care provider or a lactation consultant. Overall health care recommendations while breastfeeding  Eat 3 healthy meals and 3 snacks every day. Well-nourished mothers who are breastfeeding need an additional 450-500 calories a day. You can meet this requirement by increasing the amount of a balanced diet that   you eat.  Drink enough water to keep your urine pale yellow or clear.  Rest often, relax, and continue to take your prenatal vitamins to prevent fatigue, stress, and low vitamin and mineral levels in your body (nutrient deficiencies).  Do not use any products that contain nicotine or tobacco, such as cigarettes and e-cigarettes. Your baby may be harmed by chemicals from cigarettes that pass into breast milk and exposure to secondhand smoke. If you need help quitting, ask your health care provider.  Avoid alcohol.  Do not use illegal drugs or marijuana.  Talk with your health care provider before taking any medicines. These include over-the-counter and  prescription medicines as well as vitamins and herbal supplements. Some medicines that may be harmful to your baby can pass through breast milk.  It is possible to become pregnant while breastfeeding. If birth control is desired, ask your health care provider about options that will be safe while breastfeeding your baby. Where to find more information: [employer]: www.llli.org Contact a health care provider if:  You feel like you want to stop breastfeeding or have become frustrated with breastfeeding.  Your nipples are cracked or bleeding.  Your breasts are red, tender, or warm.  You have: ? Painful breasts or nipples. ? A swollen area on either breast. ? A fever or chills. ? Nausea or vomiting. ? Drainage other than breast milk from your nipples.  Your breasts do not become full before feedings by the fifth day after you give birth.  You feel sad and depressed.  Your baby is: ? Too sleepy to eat well. ? Having trouble sleeping. ? More than 1 week old and wetting fewer than 6 diapers in a 24-hour period. ? Not gaining weight by 5 days of age.  Your baby has fewer than 3 stools in a 24-hour period.  Your baby's skin or the white parts of his or her eyes become yellow. Get help right away if:  Your baby is overly tired (lethargic) and does not want to wake up and feed.  Your baby develops an unexplained fever. Summary  Breastfeeding offers many health benefits for infant and mothers.  Try to breastfeed your infant when he or she shows early signs of hunger.  Gently tickle or stroke your baby's lips with your finger or nipple to allow the baby to open his or her mouth. Bring the baby to your breast. Make sure that much of the areola is in your baby's mouth. Offer one side and burp the baby before you offer the other side.  Talk with your health care provider or lactation consultant if you have questions or you face problems as you breastfeed. This  information is not intended to replace advice given to you by your health care provider. Make sure you discuss any questions you have with your health care provider. Document Revised: 04/12/2017 Document Reviewed: 02/18/2016 Elsevier Patient Education  2021 Elsevier Inc.  

## 2020-07-06 NOTE — Progress Notes (Signed)
Pregnancy Screening Risk Form completed.  

## 2020-07-06 NOTE — Progress Notes (Signed)
Subjective:   Veronica Townsend is a 25 y.o. G1P0 at [redacted]w[redacted]d by early ultrasound being seen today for her first obstetrical visit.  Her obstetrical history is significant for none. Patient does intend to breast feed. Pregnancy history fully reviewed.  Patient reports no complaints.  HISTORY: OB History  Gravida Para Term Preterm AB Living  1 0 0 0 0 0  SAB IAB Ectopic Multiple Live Births  0 0 0 0 0    # Outcome Date GA Lbr Len/2nd Weight Sex Delivery Anes PTL Lv  1 Current            Last pap smear was  never History reviewed. No pertinent past medical history. History reviewed. No pertinent surgical history. Family History  Problem Relation Age of Onset  . Diabetes Mother   . Hypertension Mother   . Hypertension Father    Social History   Tobacco Use  . Smoking status: Never Smoker  . Smokeless tobacco: Never Used  Vaping Use  . Vaping Use: Never used  Substance Use Topics  . Alcohol use: Not Currently    Comment: weekly  . Drug use: Yes    Types: Marijuana   No Known Allergies Current Outpatient Medications on File Prior to Visit  Medication Sig Dispense Refill  . metroNIDAZOLE (FLAGYL) 500 MG tablet Take 1 tablet (500 mg total) by mouth 2 (two) times daily. 14 tablet 0  . promethazine (PHENERGAN) 12.5 MG tablet Take 1 tablet (12.5 mg total) by mouth every 6 (six) hours as needed for nausea or vomiting. (Patient not taking: No sig reported) 30 tablet 0   No current facility-administered medications on file prior to visit.     Exam   Vitals:   07/06/20 1454  BP: 132/63  Pulse: 80  Weight: 195 lb (88.5 kg)   Fetal Heart Rate (bpm): 161  Uterus:     Pelvic Exam: Perineum: no hemorrhoids, normal perineum   Vulva: normal external genitalia, no lesions   Vagina:  normal mucosa, white discharge   Cervix: no lesions and normal, pap smear done.   System: General: well-developed, well-nourished female in no acute distress   Skin: normal coloration and  turgor, no rashes   Neurologic: oriented, normal, negative, normal mood   Extremities: normal strength, tone, and muscle mass, ROM of all joints is normal   HEENT PERRLA, extraocular movement intact and sclera clear, anicteric   Neck supple and no masses   Respiratory:  no respiratory distress     Assessment:   Pregnancy: G1P0 Patient Active Problem List   Diagnosis Date Noted  . Supervision of low-risk first pregnancy 07/06/2020  . Elevated BP without diagnosis of hypertension 07/06/2020     Plan:  1. Encounter for supervision of low-risk first pregnancy in first trimester Initial labs drawn. Bedside ultrasound done, confirmed single IUP with normal cardiac activity Continue prenatal vitamins. Genetic Screening discussed, NIPS: ordered. Ultrasound discussed; fetal anatomic survey: ordered. Problem list reviewed and updated. The nature of [facility] with multiple MDs and other Advanced Practice Providers was explained to patient; also emphasized that residents, students are part of our team. - GC/Chlamydia probe amp ([place])not at [facility] - Genetic Screening - Culture, OB Urine - Hemoglobin A1c - CHL AMB BABYSCRIPTS SCHEDULE OPTIMIZATION - CBC/D/Plt+RPR+Rh+ABO+RubIgG... - [place] MFM OB COMP + 14 WK; Future  2. Screening for cervical cancer Pap collected today - Cytology - PAP( [place])  3. Elevated BP without  diagnosis of hypertension Three prior elevated BP's in the chart Likely diagnosis of cHTN Baseline labs next visit   Routine obstetric precautions reviewed. Return in 4 weeks (on 08/03/2020) for [facility], ob visit.

## 2020-07-07 LAB — CERVICOVAGINAL ANCILLARY ONLY
Bacterial Vaginitis (gardnerella): POSITIVE — AB
Candida Glabrata: NEGATIVE
Candida Vaginitis: POSITIVE — AB
Chlamydia: NEGATIVE
Comment: NEGATIVE
Comment: NEGATIVE
Comment: NEGATIVE
Comment: NEGATIVE
Comment: NEGATIVE
Comment: NORMAL
Neisseria Gonorrhea: NEGATIVE
Trichomonas: NEGATIVE

## 2020-07-07 LAB — CBC/D/PLT+RPR+RH+ABO+RUBIGG...
Antibody Screen: NEGATIVE
Basophils Absolute: 0 10*3/uL (ref 0.0–0.2)
Basos: 0 %
EOS (ABSOLUTE): 0 10*3/uL (ref 0.0–0.4)
Eos: 0 %
HCV Ab: 0.1 s/co ratio (ref 0.0–0.9)
HIV Screen 4th Generation wRfx: NONREACTIVE
Hematocrit: 38 % (ref 34.0–46.6)
Hemoglobin: 12.4 g/dL (ref 11.1–15.9)
Hepatitis B Surface Ag: NEGATIVE
Immature Grans (Abs): 0.1 10*3/uL (ref 0.0–0.1)
Immature Granulocytes: 1 %
Lymphocytes Absolute: 2.2 10*3/uL (ref 0.7–3.1)
Lymphs: 23 %
MCH: 26.8 pg (ref 26.6–33.0)
MCHC: 32.6 g/dL (ref 31.5–35.7)
MCV: 82 fL (ref 79–97)
Monocytes Absolute: 0.7 10*3/uL (ref 0.1–0.9)
Monocytes: 7 %
Neutrophils Absolute: 6.9 10*3/uL (ref 1.4–7.0)
Neutrophils: 69 %
Platelets: 292 10*3/uL (ref 150–450)
RBC: 4.63 x10E6/uL (ref 3.77–5.28)
RDW: 12.7 % (ref 11.7–15.4)
RPR Ser Ql: NONREACTIVE
Rh Factor: POSITIVE
Rubella Antibodies, IGG: 12.1 index (ref >0.99)
WBC: 9.9 10*3/uL (ref 3.4–10.8)

## 2020-07-07 LAB — CYTOLOGY - PAP
Comment: NEGATIVE
Diagnosis: NEGATIVE
High risk HPV: NEGATIVE

## 2020-07-07 LAB — HEMOGLOBIN A1C
Est. average glucose Bld gHb Est-mCnc: 108 mg/dL
Hgb A1c MFr Bld: 5.4 % (ref 4.8–5.6)

## 2020-07-07 LAB — HCV INTERPRETATION

## 2020-07-08 LAB — URINE CULTURE, OB REFLEX

## 2020-07-08 LAB — CULTURE, OB URINE

## 2020-07-08 MED ORDER — METRONIDAZOLE 0.75 % VA GEL: 1.0000 | Freq: Every day | VAGINAL | 0 refills | Status: AC

## 2020-07-08 MED ORDER — MICONAZOLE NITRATE 4 % VA CREA: 1.0000 | TOPICAL_CREAM | Freq: Every evening | VAGINAL | 1 refills | Status: AC

## 2020-07-08 NOTE — Addendum Note (Signed)
Addended by: Merian Capron on: 07/08/2020 08:34 AM   Modules accepted: Orders

## 2020-07-14 NOTE — BH Specialist Note (Addendum)
Integrated Behavioral Health via Telemedicine Visit  07/14/2020 Blinda Turek [id number]  Number of Integrated Behavioral Health visits: 1 Session Start time: 8:36 Session End time: 9:08 Total time:  32 Referring Provider: Scheryl Darter, MD Patient/Family location: [facility] Provider [facility]  All persons participating in visit: Patient Veronica Townsend and [facility] Casten Floren   Types of Service: Individual psychotherapy and Video visit  I connected with Jethro Poling and/or Sheryle Hail Spindel's  n/a  via  Telephone or Engineer, civil (consulting)  (Video is Surveyor, mining) and verified that I am speaking with the correct person using two identifiers. Discussed confidentiality: Yes   I discussed the limitations of telemedicine and the availability of in person appointments.  Discussed there is a possibility of technology failure and discussed alternative modes of communication if that failure occurs.  I discussed that engaging in this telemedicine visit, they consent to the provision of behavioral healthcare and the services will be billed under their insurance.  Patient and/or legal guardian expressed understanding and consented to Telemedicine visit: Yes   Presenting Concerns: Patient and/or family reports the following symptoms/concerns: Pt states her primary goals today are to find work and her own housing; nausea is decreasing, family is supportive, sleeping and eating well; prioritizes self-care and uses meditation/breathing excercises to cope with emotions.  Duration of problem: Current pregnancy; Severity of problem: moderate  Patient and/or Family's Strengths/Protective Factors: Social connections, Social and Emotional competence, Concrete supports in place (healthy food, safe environments, etc.), and Sense of purpose  Goals Addressed: Patient will:  Reduce symptoms of: stress   Increase  knowledge and/or ability of: healthy habits and stress reduction   Demonstrate ability to: Increase healthy adjustment to current life circumstances  Progress towards Goals: Ongoing  Interventions: Interventions utilized:  Solution-Focused Strategies, Psychoeducation and/or Health Education, and Link to [employer] Standardized Assessments completed:  PHQ9/GAD7 given in past two weeks  Patient and/or Family Response: Pt agrees with treatment plan  Assessment: Patient currently experiencing Psychosocial stress and Other specified counseling  Patient may benefit from psychoeducation and brief therapeutic interventions regarding coping with symptoms of current life stress .  Plan: Follow up with behavioral health clinician on : Call Wilbert Schouten at [phone] as needed Behavioral recommendations:  -Continue taking prenatal vitamin daily -Continue prioritizing self-care, and practicing meditation/breathing exercises daily for overall wellness -View virtual tour of [facility] at [email or web address] -Consider reading through Postpartum Planner on After Visit Summary -Consider housing resources on After Visit Summary Referral(s): Integrated Art gallery manager (In Clinic) and [employer]:  Housing  I discussed the assessment and treatment plan with the patient and/or parent/guardian. They were provided an opportunity to ask questions and all were answered. They agreed with the plan and demonstrated an understanding of the instructions.   They were advised to call back or seek an in-person evaluation if the symptoms worsen or if the condition fails to improve as anticipated.  Rae Lips, LCSW  Depression screen [facility] 2/9 07/06/2020 06/09/2020  Decreased Interest 1 1  Down, Depressed, Hopeless 1 2  PHQ - 2 Score 2 3  Altered sleeping 0 1  Tired, decreased energy 2 3  Change in appetite 0 1  Feeling bad or failure about yourself  0 1  Trouble concentrating  0 0  Moving slowly or fidgety/restless 0 0  Suicidal thoughts 0 0  PHQ-9 Score 4 9   GAD 7 : Generalized Anxiety Score 07/06/2020 06/09/2020  Nervous,  Anxious, on Edge 1 2  Control/stop worrying 1 2  Worry too much - different things 1 2  Trouble relaxing 1 1  Restless 0 1  Easily annoyed or irritable 1 2  Afraid - awful might happen 1 1  Total GAD 7 Score 6 11

## 2020-07-16 ENCOUNTER — Encounter: Payer: Self-pay | Admitting: *Deleted

## 2020-07-22 ENCOUNTER — Inpatient Hospital Stay (HOSPITAL_COMMUNITY)
Admission: EM | Admit: 2020-07-22 | Discharge: 2020-07-22 | Disposition: A | Discharge status: Home / Self Care | Payer: Medicaid Other | Attending: Obstetrics & Gynecology | Admitting: Obstetrics & Gynecology

## 2020-07-22 ENCOUNTER — Other Ambulatory Visit: Payer: Self-pay

## 2020-07-22 ENCOUNTER — Telehealth: Payer: Self-pay | Admitting: *Deleted

## 2020-07-22 ENCOUNTER — Encounter (HOSPITAL_COMMUNITY): Payer: Self-pay | Admitting: Obstetrics & Gynecology

## 2020-07-22 DIAGNOSIS — O26891 Other specified pregnancy related conditions, first trimester: Secondary | ICD-10-CM | POA: Insufficient documentation

## 2020-07-22 DIAGNOSIS — R0789 Other chest pain: Secondary | ICD-10-CM

## 2020-07-22 DIAGNOSIS — O219 Vomiting of pregnancy, unspecified: Secondary | ICD-10-CM | POA: Diagnosis not present

## 2020-07-22 DIAGNOSIS — Z20822 Contact with and (suspected) exposure to covid-19: Secondary | ICD-10-CM | POA: Insufficient documentation

## 2020-07-22 DIAGNOSIS — O99891 Other specified diseases and conditions complicating pregnancy: Secondary | ICD-10-CM | POA: Diagnosis not present

## 2020-07-22 DIAGNOSIS — Z3A13 13 weeks gestation of pregnancy: Secondary | ICD-10-CM

## 2020-07-22 DIAGNOSIS — O210 Mild hyperemesis gravidarum: Secondary | ICD-10-CM

## 2020-07-22 DIAGNOSIS — Z3491 Encounter for supervision of normal pregnancy, unspecified, first trimester: Secondary | ICD-10-CM

## 2020-07-22 LAB — COMPREHENSIVE METABOLIC PANEL WITH GFR
ALT: 17 U/L (ref 0–44)
AST: 17 U/L (ref 15–41)
Albumin: 3.7 g/dL (ref 3.5–5.0)
Alkaline Phosphatase: 55 U/L (ref 38–126)
Anion gap: 8 (ref 5–15)
BUN: 11 mg/dL (ref 6–20)
CO2: 23 mmol/L (ref 22–32)
Calcium: 9.5 mg/dL (ref 8.9–10.3)
Chloride: 100 mmol/L (ref 98–111)
Creatinine, Ser: 0.51 mg/dL (ref 0.44–1.00)
GFR, Estimated: >60 mL/min
Glucose, Bld: 84 mg/dL (ref 70–99)
Potassium: 3.6 mmol/L (ref 3.5–5.1)
Sodium: 131 mmol/L — ABNORMAL LOW (ref 135–145)
Total Bilirubin: 0.7 mg/dL (ref 0.3–1.2)
Total Protein: 7.7 g/dL (ref 6.5–8.1)

## 2020-07-22 LAB — CBC WITH DIFFERENTIAL/PLATELET
Abs Immature Granulocytes: 0.05 K/uL (ref 0.00–0.07)
Basophils Absolute: 0 K/uL (ref 0.0–0.1)
Basophils Relative: 0 %
Eosinophils Absolute: 0 K/uL (ref 0.0–0.5)
Eosinophils Relative: 0 %
HCT: 35.9 % — ABNORMAL LOW (ref 36.0–46.0)
Hemoglobin: 12.1 g/dL (ref 12.0–15.0)
Immature Granulocytes: 1 %
Lymphocytes Relative: 21 %
Lymphs Abs: 1.8 K/uL (ref 0.7–4.0)
MCH: 27.8 pg (ref 26.0–34.0)
MCHC: 33.7 g/dL (ref 30.0–36.0)
MCV: 82.3 fL (ref 80.0–100.0)
Monocytes Absolute: 0.7 K/uL (ref 0.1–1.0)
Monocytes Relative: 8 %
Neutro Abs: 6.2 K/uL (ref 1.7–7.7)
Neutrophils Relative %: 70 %
Platelets: 259 K/uL (ref 150–400)
RBC: 4.36 MIL/uL (ref 3.87–5.11)
RDW: 12.5 % (ref 11.5–15.5)
WBC: 8.8 K/uL (ref 4.0–10.5)
nRBC: 0 % (ref 0.0–0.2)

## 2020-07-22 LAB — URINALYSIS, ROUTINE W REFLEX MICROSCOPIC
Bilirubin Urine: NEGATIVE
Glucose, UA: NEGATIVE mg/dL
Hgb urine dipstick: NEGATIVE
Ketones, ur: 5 mg/dL — AB
Nitrite: NEGATIVE
Protein, ur: NEGATIVE mg/dL
Specific Gravity, Urine: 1.023 (ref 1.005–1.030)
pH: 6 (ref 5.0–8.0)

## 2020-07-22 LAB — RESP PANEL BY RT-PCR (FLU A&B, COVID) ARPGX2
Influenza A by PCR: NEGATIVE
Influenza B by PCR: NEGATIVE
SARS Coronavirus 2 by RT PCR: NEGATIVE

## 2020-07-22 MED ORDER — FAMOTIDINE IN NACL 20-0.9 MG/50ML-% IV SOLN
20.0000 mg | Freq: Once | INTRAVENOUS | Status: AC
  Administered 2020-07-22: 20 mg via INTRAVENOUS
  Filled 2020-07-22: qty 50

## 2020-07-22 MED ORDER — ONDANSETRON HCL 4 MG/2ML IJ SOLN
4.0000 mg | Freq: Once | INTRAMUSCULAR | Status: AC
  Administered 2020-07-22: 4 mg via INTRAVENOUS
  Filled 2020-07-22: qty 2

## 2020-07-22 MED ORDER — LACTATED RINGERS IV BOLUS
1000.0000 mL | Freq: Once | INTRAVENOUS | Status: AC
  Administered 2020-07-22: 1000 mL via INTRAVENOUS

## 2020-07-22 NOTE — MAU Note (Signed)
Presents c/o N/V, reports unable to keep anything down since Tuesday.  Also c/o fatigue and chest tightness.  States chest tightness occurs when taking deep breath, has improved since yesterday.  Denies VB or LOF.

## 2020-07-22 NOTE — MAU Provider Note (Signed)
Chief Complaint:  Emesis, Nausea, Fatigue, and Chest tightness   Event Date/Time   First Provider Initiated Contact with Patient 07/22/20 1316     HPI: Veronica Townsend is a 25 y.o. G1P0 at 64w6dwho presents to maternity admissions reporting new onset nausea/vomiting with some chest tightness that began two days ago. She'd had nausea/vomiting earlier in the pregnancy but it had eased up enough that she's only been vomiting occasionally. Denies diarrhea, cramping or vaginal bleeding. Possible Covid exposure last weekend. No headache, back ache, body aches or fever.    [facility]  History reviewed. No pertinent past medical history. OB History  Gravida Para Term Preterm AB Living  1            SAB IAB Ectopic Multiple Live Births               # Outcome Date GA Lbr Len/2nd Weight Sex Delivery Anes PTL Lv  1 Current            History reviewed. No pertinent surgical history. Family History  Problem Relation Age of Onset   Diabetes Mother    Hypertension Mother    Hypertension Father    Social History   Tobacco Use   Smoking status: Never   Smokeless tobacco: Never  Vaping Use   Vaping Use: Never used  Substance Use Topics   Alcohol use: Not Currently    Comment: weekly   Drug use: Yes    Types: Marijuana   Not on File No medications prior to admission.   I have reviewed patient's Past Medical Hx, Surgical Hx, Family Hx, Social Hx, medications and allergies.   ROS:  Review of Systems  Constitutional:  Negative for chills, fatigue and fever.  HENT:  Negative for congestion and sore throat.   Respiratory:  Positive for chest tightness. Negative for cough and shortness of breath.   Cardiovascular:  Negative for chest pain.  Gastrointestinal:  Positive for nausea and vomiting. Negative for diarrhea.  Genitourinary:  Negative for vaginal bleeding and vaginal discharge.  Musculoskeletal:  Negative for back pain and myalgias.  Neurological:   Negative for dizziness, light-headedness and headaches.   Physical Exam  Patient Vitals for the past 24 hrs:  BP Temp Temp src Pulse Resp SpO2 Height Weight  07/22/20 1520 133/69 -- -- 69 -- -- -- --  07/22/20 1301 139/79 97.9 F (36.6 C) Oral 86 20 99 % -- --  07/22/20 1255 -- -- -- -- -- -- _0  (1.753 m) 196 lb (88.9 kg)   Constitutional: Well-developed, well-nourished female in no acute distress.  Cardiovascular: normal rate & rhythm, no murmur Respiratory: normal effort, lung sounds clear throughout GI: Abd soft, non-tender, gravid appropriate for gestational age. Pos BS x 4 MS: Extremities nontender, no edema, normal ROM Neurologic: Alert and oriented x 4.  GU: no CVA tenderness Pelvic exam deferred FHR: 154   Labs: Results for orders placed or performed during the hospital encounter of 07/22/20 (from the past 24 hour(s))  Urinalysis, Routine w reflex microscopic Urine, Clean Catch     Status: Abnormal   Collection Time: 07/22/20  1:03 PM  Result Value Ref Range   Color, Urine YELLOW YELLOW   APPearance CLOUDY (A) CLEAR   Specific Gravity, Urine 1.023 1.005 - 1.030   pH 6.0 5.0 - 8.0   Glucose, UA NEGATIVE NEGATIVE mg/dL   Hgb urine dipstick NEGATIVE NEGATIVE   Bilirubin Urine NEGATIVE NEGATIVE   Ketones,  ur 5 (A) NEGATIVE mg/dL   Protein, ur NEGATIVE NEGATIVE mg/dL   Nitrite NEGATIVE NEGATIVE   Leukocytes,Ua LARGE (A) NEGATIVE   RBC / HPF 0-5 0 - 5 RBC/hpf   WBC, UA 11-20 0 - 5 WBC/hpf   Bacteria, UA RARE (A) NONE SEEN   Squamous Epithelial / LPF 6-10 0 - 5   Mucus PRESENT    Budding Yeast PRESENT    Non Squamous Epithelial 0-5 (A) NONE SEEN  Comprehensive metabolic panel     Status: Abnormal   Collection Time: 07/22/20  1:33 PM  Result Value Ref Range   Sodium 131 (L) 135 - 145 mmol/L   Potassium 3.6 3.5 - 5.1 mmol/L   Chloride 100 98 - 111 mmol/L   CO2 23 22 - 32 mmol/L   Glucose, Bld 84 70 - 99 mg/dL   BUN 11 6 - 20 mg/dL   Creatinine, Ser 0.51 0.44 -  1.00 mg/dL   Calcium 9.5 8.9 - 10.3 mg/dL   Total Protein 7.7 6.5 - 8.1 g/dL   Albumin 3.7 3.5 - 5.0 g/dL   AST 17 15 - 41 U/L   ALT 17 0 - 44 U/L   Alkaline Phosphatase 55 38 - 126 U/L   Total Bilirubin 0.7 0.3 - 1.2 mg/dL   GFR, Estimated >60 >60 mL/min   Anion gap 8 5 - 15  CBC with Differential/Platelet     Status: Abnormal   Collection Time: 07/22/20  1:33 PM  Result Value Ref Range   WBC 8.8 4.0 - 10.5 K/uL   RBC 4.36 3.87 - 5.11 MIL/uL   Hemoglobin 12.1 12.0 - 15.0 g/dL   HCT 35.9 (L) 36.0 - 46.0 %   MCV 82.3 80.0 - 100.0 fL   MCH 27.8 26.0 - 34.0 pg   MCHC 33.7 30.0 - 36.0 g/dL   RDW 12.5 11.5 - 15.5 %   Platelets 259 150 - 400 K/uL   nRBC 0.0 0.0 - 0.2 %   Neutrophils Relative % 70 %   Neutro Abs 6.2 1.7 - 7.7 K/uL   Lymphocytes Relative 21 %   Lymphs Abs 1.8 0.7 - 4.0 K/uL   Monocytes Relative 8 %   Monocytes Absolute 0.7 0.1 - 1.0 K/uL   Eosinophils Relative 0 %   Eosinophils Absolute 0.0 0.0 - 0.5 K/uL   Basophils Relative 0 %   Basophils Absolute 0.0 0.0 - 0.1 K/uL   Immature Granulocytes 1 %   Abs Immature Granulocytes 0.05 0.00 - 0.07 K/uL  Resp Panel by RT-PCR (Flu A&B, Covid) Nasopharyngeal Swab     Status: None   Collection Time: 07/22/20  1:43 PM   Specimen: Nasopharyngeal Swab; Nasopharyngeal(NP) swabs in vial transport medium  Result Value Ref Range   SARS Coronavirus 2 by RT PCR NEGATIVE NEGATIVE   Influenza A by PCR NEGATIVE NEGATIVE   Influenza B by PCR NEGATIVE NEGATIVE    Imaging:  No results found.  MAU Course: Orders Placed This Encounter  Procedures   Resp Panel by RT-PCR (Flu A&B, Covid) Nasopharyngeal Swab   Urinalysis, Routine w reflex microscopic Urine, Clean Catch   Comprehensive metabolic panel   CBC with Differential/Platelet   Airborne and Contact precautions   Discharge patient   Meds ordered this encounter  Medications   lactated ringers bolus 1,000 mL   ondansetron (ZOFRAN) injection 4 mg   famotidine (PEPCID) IVPB  20 mg premix   MDM: LR bolus, zofran and pepcid given with complete relief  of nausea and chest tightness. PO challenge successful and covid test negative. Labs normal.  Assessment: 1. Nausea and vomiting during pregnancy   2. [redacted] weeks gestation of pregnancy   3. Fetal heart tones present, first trimester    Plan: Discharge home in stable condition with second trimester precautions     [place] [facility] [place] [facility]. Go to.   Specialty: Obstetrics and Gynecology Why: as scheduled for ongoing prenatal care Contact information: [address]  [place] [address] [phone]                Allergies as of 07/22/2020   Not on File      Medication List     STOP taking these medications    metroNIDAZOLE 500 MG tablet Commonly known as: FLAGYL   promethazine 12.5 MG tablet Commonly known as: PHENERGAN       TAKE these medications    Blood Pressure Kit Devi 1 Device by Does not apply route as needed.       Gaylan Gerold, CNM, MSN, [place] Certified Nurse Midwife, [facility]

## 2020-07-22 NOTE — Telephone Encounter (Signed)
Reyne called front desk and call transferred to  nurse. Veronica Townsend reports she had not been able to keep anything down for 2 days. We discussed she is [redacted]w[redacted]d pregnant and she confirms she feels faint and weakness. I advised she needs to go to  [facility] MAU for evaluation for dehydration / IV fluids. I advised she have someone drive her and she states she has someone who will drive  her. Veronica Prevost,RN

## 2020-07-23 ENCOUNTER — Other Ambulatory Visit: Payer: Self-pay

## 2020-07-23 DIAGNOSIS — B9689 Other specified bacterial agents as the cause of diseases classified elsewhere: Secondary | ICD-10-CM

## 2020-07-23 DIAGNOSIS — N760 Acute vaginitis: Secondary | ICD-10-CM

## 2020-07-23 DIAGNOSIS — B379 Candidiasis, unspecified: Secondary | ICD-10-CM

## 2020-07-23 MED ORDER — TERCONAZOLE 0.4 % VA CREA: 1.0000 | TOPICAL_CREAM | Freq: Every day | VAGINAL | 0 refills | Status: DC

## 2020-07-23 MED ORDER — METRONIDAZOLE 0.75 % VA GEL
1.0000 | Freq: Every day | VAGINAL | 0 refills | Status: DC
Start: 2020-07-23 — End: 2020-10-15

## 2020-07-28 ENCOUNTER — Ambulatory Visit (INDEPENDENT_AMBULATORY_CARE_PROVIDER_SITE_OTHER): Payer: PRIVATE HEALTH INSURANCE | Admitting: Clinical

## 2020-07-28 DIAGNOSIS — Z7189 Other specified counseling: Secondary | ICD-10-CM | POA: Diagnosis not present

## 2020-07-28 DIAGNOSIS — Z658 Other specified problems related to psychosocial circumstances: Secondary | ICD-10-CM | POA: Diagnosis not present

## 2020-07-28 NOTE — Patient Instructions (Addendum)
Center for [facility] at [facility] for Women [place], [place] [address] [phone] (main office) [phone] ([place] office)  Eugene Gavia also put the Postpartum Planner at the end of  the housing resource list. Thank you, Roselyn Reef  Www.[email or web address]   ReportZoo.com.cy  [employer] Program, applications begin January each year)   [place] (serves [place], [place], [place], [place], [place], [place], [place], [place], [place], [place], [place], [place], and [place] counties) [address] [place], [place] [address] [phone] [email or web address]  **Rental assistance, Home Rehabilitation,Weatherization Assistance Program, Forensic psychologist, Housing Voucher Program  [employer] for Housing and [employer] Studies: Chief Financial Officer Resources to residents of [place], [place], and [place] Make sure you have your documents ready, including:  (Household income verification: 2 months pay stubs, unemployment/social security award letter, statement of no income for all household members over 3) Photo ID for all household members over 18 Utility Bill/Rent Ledger/Lease: must show past due amount for utilities/rent, or the rental agreement if rent is current 2. Start your application online or by paper (in [place] or [place]) at:     http://[email or web address]  3. Once you have completed the online application, you will get an email confirmation message from the county. Expect to hear back by phone or email at least 6-10 weeks from submitting your application.  4. While you wait:  Call [phone] to check in on your application Let your landlord know that you've applied. Your landlord will be asked to  submit documents (W-9) during this application process. Payments will be made directly to the landlord/property [place] or utility assistance for [employer], [place], and [place] at [email or web address]://[email or web address] Questions? Call or email Renee at [phone] or [email or web address]   Eviction Mediation Program: The HOPE Program Https://www.rebuild.http://mills-[email or web address]/ HOPE Progam serves low-income renters in [place] counties, defined as less than or equal to 80% of the area median income for the county where the renter lives. In the following 12 counties, you should apply to your local rent and utility assistance program INSTEAD OF the HOPE Program: [place], [place], [place], [place], [place], [place], [place], [place], [place], [place], [place]  If you live outside of [place], contact [place] call center at [phone] to talk to a Program Representative Monday-Friday, 8am-5pm Note that Native American tribes also received federal funding for rent and utility assistance programs. Recognized members of the following tribes will be served by programs managed by tribal governments, including: [place] Indians, [place], [place], [place] of [place] and [place], [place], [phone] [email or web address] .[employer], [place], [phone] scrumple_[email or web address] .[place] Natalia [address] [place], [place] [address] [phone] www.gha-[place].[facility] [address] [place], [place] [address] [phone] https://manning.[email or web address] **Programs include: Engineer, building services and Housing Counseling, Healthy Doctor, general practice, Homeless Prevention and [place] [address] [place], [place], [place] [address] [phone] www.https://www.farmer-stevens.info/ **housing applications/recertification; tax payment relief/exemption under specific qualifications  [facility] [address], [place], [place] [address] www.onlinegreensboro.com/~maryshouse **transitional housing for women in recovery who have minor children or are pregnant  [place], [place], [place] [address] ArtistMovie.se  **emergency shelter and support services  for families facing homelessness  [facility] [address], [place], [place] [address] [phone] www.youthfocus.org **transitional housing to pregnant women; emergency housing for youth who have run away, are experiencing a family crisis, are victims of abuse or neglect, or are homeless  [facility] [address], [place], [place] [address] [phone] ircgso.org **Drop-in center for people experiencing homelessness; overnight warming center when temperature is 25 degrees or below  Re-Entry Staffing [place], [place], [place] [address] [phone] https://reentrystaffingagency.org/ **help with affordable housing to people experiencing homelessness or unemployment due to incarceration  [facility] [address], [place], [place] [address] [phone] www.greensborourbanministry.org  **emergency and transitional housing, rent/mortgage assistance, utility assistance  [facility] [address], [place], [place] [address] [phone] www.salvationarmyofgreensboro.org **emergency and transitional housing  Habitat for [employer] [place], [place], [place] [address] [phone] Www.habitatgreensboro.[place], [place], [place] [address] [phone] https://chshousing.org **[place] and [place] [facility]  [facility] [address] [place] [address], [place], [place] [address] [phone] arrivance.com **home buyer education courses, foreclosure prevention  [facility] [address], [place], [place] [address] [phone] WirelessNovelties.no **Environmental Exposure Assessment (investigation of homes where either children or pregnant women with a confirmed elevated blood lead level reside)  [facility], [place], [place] [address] [phone] http://www.perez.com/ **Home Expense Assistance/Repairs Program; offers home accessibility updates, such as ramps or bars in the bathroom  [facility] [address] [place], [place] [address] [phone] https://www.self-help.org/locations/-branch **Offers credit-building and banking services to people unable to use [place] of [facility] [place], [place], [place] [address] [phone] RoulettePays.com.br   [facility] [address], [place], [place] [address] [phone]  ClubMonetize.fr **housing applications/recertification, emergency and transitional housing  [employer] of [employer] [address], [place], [place] [address] [phone] www.odm-hp.org  **emergency and permanent housing; rent/mortgage payment assistance  Habitat for [employer], [place] and [place] [address], [place], [place] [address] [phone] ArchitectReviews.com.au  [facility] [place], [employer] [place], [place], [place] [address] www.familyservice-piedmont.org **emergency shelter for victims of domestic violence and sexual assault  [facility] [address] [place], [place] [address] [phone] www.senior-resources-guilford.org **Home expense assistance/repairs for older adults     BRAINSTORMING  Develop a Plan Goals: Provide a way to start conversation about your new life with a baby Assist parents in recognizing and using resources within their reach Help pave the way before birth for an easier period of transition afterwards.  Make a list of the following information to keep in a central location: Full name of Mom and Partner: _____________________________________________ 73 full name and Date of Birth: ___________________________________________ Home Address: ___________________________________________________________ ________________________________________________________________________ Home Phone: ____________________________________________________________ Parents' cell numbers: _____________________________________________________ ________________________________________________________________________ Name and contact info for OB: ______________________________________________ Name and contact info for Pediatrician:________________________________________ Contact info for Lactation Consultants: ________________________________________  REST and SLEEP *You each need at least 4-5 hours of uninterrupted sleep every day. Write specific names and contact information.* How are you going to rest in the postpartum period? While partner's home? When partner returns to work? When you both return to work? Where will your baby sleep? Who is available to help during the day?  Evening? Night? Who could move in for a period to help support you? What are some ideas to help you get enough  sleep? __________________________________________________________________________________________________________________________________________________________________________________________________________________________________________ NUTRITIOUS FOOD AND DRINK *Plan for meals before your baby is born so you can have healthy food to eat during the immediate postpartum period.* Who will look after breakfast? Lunch? Dinner? List names and contact information. Brainstorm quick, healthy ideas for each meal. What can you do before baby is born to prepare meals for the postpartum period? How can others help you with meals? Which grocery stores provide online shopping and delivery? Which restaurants offer take-out or delivery options? ______________________________________________________________________________________________________________________________________________________________________________________________________________________________________________________________________________________________________________________________________________________________________________________________________  CARE FOR MOM *It's important that mom is cared for and pampered in the postpartum period. Remember, the most important ways new mothers need care are: sleep, nutrition, gentle exercise, and time off.* Who can come take care of mom during this period? Make a list of people with their contact information. List some activities that make you feel cared for, rested, and energized? Who can make sure you have opportunities to do these things? Does mom have a space of her very own within your home that's just for her? Make a "[facility]" where she can be comfortable, rest, and renew herself  daily. ______________________________________________________________________________________________________________________________________________________________________________________________________________________________________________________________________________________________________________________________________________________________________________________________________    CARE FOR AND FEEDING BABY *Knowledgeable and encouraging people will offer the best support with regard to feeding your baby.* Educate yourself and choose the best feeding option for your baby. Make a list of people who will guide, support, and be a resource for you as your care for and feed your baby. (Friends that have breastfed or are currently breastfeeding, lactation consultants, breastfeeding support groups, etc.) Consider a postpartum doula. (These websites can give you information: dona.org & BuyingShow.es) Seek out local breastfeeding resources like the breastfeeding support group at [employer] or [employer]. ______________________________________________________________________________________________________________________________________________________________________________________________________________________________________________________________________________________________________________________________________________________________________________________________________  Verner Chol AND ERRANDS Who can help with a thorough cleaning before baby is born? Make a list of people who will help with housekeeping and chores, like laundry, light cleaning, dishes, bathrooms, etc. Who can run some errands for you? What can you do to make sure you are stocked with basic supplies before baby is born? Who is going to do the  shopping? ______________________________________________________________________________________________________________________________________________________________________________________________________________________________________________________________________________________________________________________________________________________________________________________________________     Family Adjustment *Nurture yourselves.it helps parents be more loving and allows for better bonding with their child.* What sorts of things do you and partner enjoy doing together? Which activities help you to connect and strengthen your relationship? Make a list of those things. Make a list of people whom you trust to care for your baby so you can have some time together as a couple. What types of things help partner feel connected to Mom? Make a list. What needs will partner have in order to bond with baby? Other children? Who will care for them when you go into labor and while you are in the hospital? Think about what the needs of your older children might be. Who can help you meet those needs? In what ways are you helping them prepare for bringing baby home? List some specific strategies you have for family adjustment. _______________________________________________________________________________________________________________________________________________________________________________________________________________________________________________________________________________________________________________________________________________  SUPPORT *Someone who can empathize with experiences normalizes your problems and makes them more bearable.* Make a list of other friends, neighbors, and/or co-workers you know with infants (and small children, if applicable) with whom you can connect. Make a list of local or online support groups, mom groups, etc. in which you can be  involved. ______________________________________________________________________________________________________________________________________________________________________________________________________________________________________________________________________________________________________________________________________________________________________________________________________  Childcare Plans Investigate  and plan for childcare if mom is returning to work. Talk about mom's concerns about her transition back to work. Talk about partner's concerns regarding this transition.  Mental Health *Your mental health is one of the highest priorities for a pregnant or postpartum mom.* 1 in 5 women experience anxiety and/or depression from the time of conception through the first year after birth. Postpartum Mood Disorders are the #1 complication of pregnancy and childbirth and the suffering experienced by these mothers is not necessary! These illnesses are temporary and respond well to treatment, which often includes self-care, social support, talk therapy, and medication when needed. Women experiencing anxiety and depression often say things like: "I'm supposed to be happy.why do I feel so sad?", "Why can't I snap out of it?", "I'm having thoughts that scare me." There is no need to be embarrassed if you are feeling these symptoms: Overwhelmed, anxious, angry, sad, guilty, irritable, hopeless, exhausted but can't sleep You are NOT alone. You are NOT to blame. With help, you WILL be well. Where can I find help? Medical professionals such as your OB, midwife, gynecologist, family practitioner, primary care provider, pediatrician, or mental health providers; [facility] support groups: Feelings After Birth, Breastfeeding Support Group, Baby and Me Group, and Fit 4 Two exercise classes. You have permission to ask for help. It will confirm your feelings, validate your experiences,  share/learn coping strategies, and gain support and encouragement as you heal. You are important! BRAINSTORM Make a list of local resources, including resources for mom and for partner. Identify support groups. Identify people to call late at night - include names and contact info. Talk with partner about perinatal mood and anxiety disorders. Talk with your OB, midwife, and doula about baby blues and about perinatal mood and anxiety disorders. Talk with your pediatrician about perinatal mood and anxiety disorders.   Support & Sanity Savers   What do you really need?  Basics In preparing for a new baby, many expectant parents spend hours shopping for baby clothes, decorating the nursery, and deciding which car seat to buy. Yet most don't think much about what the reality of parenting a newborn will be like, and what they need to make it through that. So, here is the advice of experienced parents. We know you'll read this, and think "they're exaggerating, I don't really need that." Just trust [place] on these, OK? Plan for all of this, and if it turns out you don't need it, come back and teach [place] how you did it!  Must-Haves (Once baby's survival needs are met, make sure you attend to your own survival needs!) Sleep An average newborn sleeps 16-18 hours per day, over 6-7 sleep periods, rarely more than three hours at a time. It is normal and healthy for a newborn to wake throughout the night... but really hard on parents!! Naps. Prioritize sleep above any responsibilities like: cleaning house, visiting friends, running errands, etc.  Sleep whenever baby sleeps. If you can't nap, at least have restful times when baby eats. The more rest you get, the more patient you will be, the more emotionally stable, and better at solving problems.  Food You may not have realized it would be difficult to eat when you have a newborn. Yet, when we talk to countless new parents, they say things like "it may be 2:00 pm  when I realize I haven't had breakfast yet." Or "every time we sit down to dinner, baby needs to eat, and my food gets cold,  so I don't bother to eat it." Finger food. Before your baby is born, stock up with one months' worth of food that: 1) you can eat with one hand while holding a baby, 2) doesn't need to be prepped, 3) is good hot or cold, 4) doesn't spoil when left out for a few hours, and 5) you like to eat. Think about: nuts, dried fruit, Clif bars, pretzels, jerky, gogurt, baby carrots, apples, bananas, crackers, cheez-n-crackers, string cheese, hot pockets or frozen burritos to microwave, garden burgers and breakfast pastries to put in the toaster, yogurt drinks, etc. Restaurant Menus. Make lists of your favorite restaurants & menu items. When family/friends want to help, you can give specific information without much thought. They can either bring you the food or send gift cards for just the right meals. Freezer Meals.  Take some time to make a few meals to put in the freezer ahead of time.  Easy to freeze meals can be anything such as soup, lasagna, chicken pie, or spaghetti sauce. Set up a Meal Schedule.  Ask friends and family to sign up to bring you meals during the first few weeks of being home. (It can be passed around at baby showers!) You have no idea how helpful this will be until you are in the throes of parenting.  https://hamilton-woodard.com/ is a great website to check out. Emotional Support Know who to call when you're stressed out. Parenting a newborn is very challenging work. There are times when it totally overwhelms your normal coping abilities. EVERY NEW PARENT NEEDS TO HAVE A PLAN FOR WHO TO CALL WHEN THEY JUST CAN'T COPE ANY MORE. (And it has to be someone other than the baby's other parent!) Before your baby is born, come up with at least one person you can call for support - write their phone number down and post it on the refrigerator. Anxiety & Sadness. Baby blues are normal after  pregnancy; however, there are more severe types of anxiety & sadness which can occur and should not be ignored.  They are always treatable, but you have to take the first step by reaching out for help. [facility] offers a "Mom Talk" group which meets every Tuesday from 10 am - 11 am.  This group is for new moms who need support and connection after their babies are born.  Call [phone].  Really, Really Helpful (Plan for them! Make sure these happen often!!) Physical Support with Taking Care of Yourselves Asking friends and family. Before your baby is born, set up a schedule of people who can come and visit and help out (or ask a friend to schedule for you). Any time someone says "let me know what I can do to help," sign them up for a day. When they get there, their job is not to take care of the baby (that's your job and your joy). Their job is to take care of you!  Postpartum doulas. If you don't have anyone you can call on for support, look into postpartum doulas:  professionals at helping parents with caring for baby, caring for themselves, getting breastfeeding started, and helping with household tasks. www.padanc.org is a helpful website for learning about doulas in our area. Peer Support / Parent Groups Why: One of the greatest ideas for new parents is to be around other new parents. Parent groups give you a chance to share and listen to others who are going through the same season of life, get a sense of what  is normal infant development by watching several babies learn and grow, share your stories of triumph and struggles with empathetic ears, and forgive your own mistakes when you realize all parents are learning by trial and error. Where to find: There are many places you can meet other new parents throughout our community.  [facility] offers the following classes for new moms and their little ones:  Baby and Me (Birth to [place]) and Breastfeeding Support Group. Go to  www.conehealthybaby.com or call [phone] for more information. Time for your Relationship It's easy to get so caught up in meeting baby's immediate needs that it's hard to find time to connect with your partner, and meet the needs of your relationship. It's also easy to forget what "quality time with your partner" actually looks like. If you take your baby on a date, you'd be amazed how much of your couple time is spent feeding the baby, diapering the baby, admiring the baby, and talking about the baby. Dating: Try to take time for just the two of you. Babysitter tip: Sometimes when moms are breastfeeding a newborn, they find it hard to figure out how to schedule outings around baby's unpredictable feeding schedules. Have the babysitter come for a three hour period. When she comes over, if baby has just eaten, you can leave right away, and come back in two hours. If baby hasn't fed recently, you start the date at home. Once baby gets hungry and gets a good feeding in, you can head out for the rest of your date time. Date Nights at Home: If you can't get out, at least set aside one evening a week to prioritize your relationship: whenever baby dozes off or doesn't have any immediate needs, spend a little time focusing on each other. Potential conflicts: The main relationship conflicts that come up for new parents are: issues related to sexuality, financial stresses, a feeling of an unfair division of household tasks, and conflicts in parenting styles. The more you can work on these issues before baby arrives, the better!  Fun and Frills (Don't forget these. and don't feel guilty for indulging in them!) Everyone has something in life that is a fun little treat that they do just for themselves. It may be: reading the morning paper, or going for a daily jog, or having coffee with a friend once a week, or going to a movie on Friday nights, or fine chocolates, or bubble baths, or curling up with a good  book. Unless you do fun things for yourself every now and then, it's hard to have the energy for fun with your baby. Whatever your "special" treats are, make sure you find a way to continue to indulge in them after your baby is born. These special moments can recharge you, and allow you to return to baby with a new joy   PERINATAL MOOD DISORDERS: [place]   Emergency and Crisis Resources:  If you are an imminent risk to self or others, are experiencing intense personal distress, and/or have noticed significant changes in activities of daily living, call:  [facility]: [place]: [place]: [phone] Or visit the following crisis centers: Local Emergency Departments [facility]: [address] [place]. Hours: 8:30AM-5PM. Insurance Accepted: Medicaid, Medicare, and Uninsured.  [place], [place] Mon-Friday 8am-3pm  [phone]  Non-Crisis Resources: To identify specific providers that are covered by your insurance, contact your insurance company or local agencies: [place]: [phone] [facility] and [place]: [phone] [place]: [phone] [employer]- [place] [phone]                                                      Outpatient therapy and medication management providers:  [facility] [phone] Hours: 9AM-5PM  Insurance Accepted: Alben Spittle, Lorella Nimrod, Freddrick March, [place], Medicare  [facility] ([place]) [phone] Hours: 8AM-5PM  nsurance Accepted: All insurances EXCEPT AARP, [place], [place], and [place]: [phone]             Hours: 8AM-8PM Insurance  Accepted: Cristal Ford, Freddrick March, [place], Medicare, [phone] [facility]: [phone] Hours: 8:30AM-7PM Insurance Accepted: Cristal Ford, Medicaid, Medicare, Tricare, [employer] Counseling:  [place] Accepted:  Holland Falling, Lorella Nimrod, [facility], [place], [facility] [phone] Hours: 9AM-5:30PM Insurance Accepted: Alben Spittle, [place] Cr[place], and Medicaid, Medicare, [employer] [facility] Counseling:  [phone] Hours: 9am-5pm Insurance Accepted: BCBS; they do not accept Medicaid/Medicare [facility] [place]: [phone] Hours: 9am-9pm Insurance Accepted: All major insurance including Medicaid and Medicare [facility]: [phone] Hours: 9AM-5:30PM Insurance Accepted: All insurances EXCEPT Medicaid and Medicare. [facility]: [place]: [phone] [place]:  [place] (support for children in the NICU and/or with special needs), [place] [employer]: [phone]                                                                                     Online Resources: [employer]partum [employer]: http://jones-berg.com/  800-944-4PPD 2Moms Supporting Moms:  www.momssupportingmoms.net

## 2020-08-03 ENCOUNTER — Other Ambulatory Visit: Payer: Self-pay

## 2020-08-03 ENCOUNTER — Ambulatory Visit (INDEPENDENT_AMBULATORY_CARE_PROVIDER_SITE_OTHER): Payer: Medicaid Other | Admitting: Student

## 2020-08-03 VITALS — BP 132/80 | HR 84 | Wt 199.3 lb

## 2020-08-03 DIAGNOSIS — Z3A15 15 weeks gestation of pregnancy: Secondary | ICD-10-CM

## 2020-08-03 DIAGNOSIS — Z3402 Encounter for supervision of normal first pregnancy, second trimester: Secondary | ICD-10-CM | POA: Diagnosis not present

## 2020-08-03 NOTE — Progress Notes (Signed)
   PRENATAL VISIT NOTE  Subjective:  Veronica Townsend is a 25 y.o. G1P0 at [redacted]w[redacted]d being seen today for ongoing prenatal care.  She is currently monitored for the following issues for this low-risk pregnancy and has Supervision of low-risk first pregnancy and Elevated BP without diagnosis of hypertension on their problem list.  Patient reports no complaints.  Contractions: Not present. Vag. Bleeding: None.  Movement: Absent. Denies leaking of fluid.   The following portions of the patient's history were reviewed and updated as appropriate: allergies, current medications, past family history, past medical history, past social history, past surgical history and problem list.   Objective:   Vitals:   08/03/20 1414  BP: 132/80  Pulse: 84  Weight: 199 lb 4.8 oz (90.4 kg)    Fetal Status:     Movement: Absent     General:  Alert, oriented and cooperative. Patient is in no acute distress.  Skin: Skin is warm and dry. No rash noted.   Cardiovascular: Normal heart rate noted  Respiratory: Normal respiratory effort, no problems with respiration noted  Abdomen: Soft, gravid, appropriate for gestational age.  Pain/Pressure: Absent     Pelvic: Cervical exam deferred        Extremities: Normal range of motion.  Edema: None  Mental Status: Normal mood and affect. Normal behavior. Normal judgment and thought content.   Assessment and Plan:  Pregnancy: G1P0 at [redacted]w[redacted]d 1. Encounter for supervision of low-risk first pregnancy in second trimester  Pt informed that the ultrasound is considered a limited OB ultrasound and is not intended to be a complete ultrasound exam.  Patient also informed that the ultrasound is not being completed with the intent of assessing for fetal or placental anomalies or any pelvic abnormalities.  Explained that the purpose of today's ultrasound is to assess for  viability.  Patient acknowledges the purpose of the exam and the limitations of the study.   Active fetal movement  noted.   -reviewed genetic testing results with patient - AFP, Serum, Open Spina Bifida - Protein / creatinine ratio, urine due to elevated BP before,  -Keep [place] appt at end of July   Preterm labor symptoms and general obstetric precautions including but not limited to vaginal bleeding, contractions, leaking of fluid and fetal movement were reviewed in detail with the patient. Please refer to After Visit Summary for other counseling recommendations.   Return in about 4 weeks (around 08/31/2020), or LROB  with KK.  Future Appointments  Date Time Provider Department Center  08/27/2020  1:45 PM [facility] US5 [facility]  09/02/2020  1:35 PM Marylene Land, CNM [facility]    Marylene Land, [place]

## 2020-08-04 LAB — PROTEIN / CREATININE RATIO, URINE
Creatinine, Urine: 177 mg/dL
Protein, Ur: 17.2 mg/dL
Protein/Creat Ratio: 97 mg/g creat (ref 0–200)

## 2020-08-05 LAB — AFP, SERUM, OPEN SPINA BIFIDA
AFP MoM: 1.91
AFP Value: 56.4 ng/mL
Gest. Age on Collection Date: 15.4 weeks
Maternal Age At EDD: 24.1 yr
OSBR Risk 1 IN: 1975
Test Results:: NEGATIVE
Weight: 199 [lb_av]

## 2020-08-19 DIAGNOSIS — D563 Thalassemia minor: Secondary | ICD-10-CM | POA: Insufficient documentation

## 2020-08-27 ENCOUNTER — Other Ambulatory Visit: Payer: Self-pay | Admitting: *Deleted

## 2020-08-27 ENCOUNTER — Ambulatory Visit: Payer: Medicaid Other | Attending: Family Medicine

## 2020-08-27 ENCOUNTER — Other Ambulatory Visit: Payer: Self-pay | Admitting: Family Medicine

## 2020-08-27 ENCOUNTER — Other Ambulatory Visit: Payer: Self-pay

## 2020-08-27 DIAGNOSIS — Z3401 Encounter for supervision of normal first pregnancy, first trimester: Secondary | ICD-10-CM

## 2020-08-27 DIAGNOSIS — D563 Thalassemia minor: Secondary | ICD-10-CM

## 2020-09-02 ENCOUNTER — Encounter: Payer: Medicaid Other | Admitting: Student

## 2020-09-24 ENCOUNTER — Other Ambulatory Visit: Payer: Self-pay

## 2020-09-24 ENCOUNTER — Ambulatory Visit: Payer: Medicaid Other | Admitting: *Deleted

## 2020-09-24 ENCOUNTER — Other Ambulatory Visit: Payer: Self-pay | Admitting: *Deleted

## 2020-09-24 ENCOUNTER — Ambulatory Visit: Payer: Medicaid Other | Attending: Maternal & Fetal Medicine

## 2020-09-24 VITALS — BP 132/66 | HR 74

## 2020-09-24 DIAGNOSIS — O10912 Unspecified pre-existing hypertension complicating pregnancy, second trimester: Secondary | ICD-10-CM

## 2020-09-24 DIAGNOSIS — Z3A23 23 weeks gestation of pregnancy: Secondary | ICD-10-CM

## 2020-09-24 DIAGNOSIS — D563 Thalassemia minor: Secondary | ICD-10-CM | POA: Insufficient documentation

## 2020-09-24 DIAGNOSIS — O132 Gestational [pregnancy-induced] hypertension without significant proteinuria, second trimester: Secondary | ICD-10-CM

## 2020-09-24 DIAGNOSIS — Z3402 Encounter for supervision of normal first pregnancy, second trimester: Secondary | ICD-10-CM | POA: Insufficient documentation

## 2020-09-24 DIAGNOSIS — Z148 Genetic carrier of other disease: Secondary | ICD-10-CM

## 2020-09-24 DIAGNOSIS — Z363 Encounter for antenatal screening for malformations: Secondary | ICD-10-CM | POA: Diagnosis not present

## 2020-09-28 ENCOUNTER — Encounter: Payer: PRIVATE HEALTH INSURANCE | Admitting: Family Medicine

## 2020-10-13 ENCOUNTER — Ambulatory Visit (INDEPENDENT_AMBULATORY_CARE_PROVIDER_SITE_OTHER): Payer: PRIVATE HEALTH INSURANCE | Admitting: Certified Nurse Midwife

## 2020-10-13 ENCOUNTER — Other Ambulatory Visit: Payer: Self-pay

## 2020-10-13 VITALS — BP 141/79 | HR 88 | Wt 231.3 lb

## 2020-10-13 DIAGNOSIS — Z3A25 25 weeks gestation of pregnancy: Secondary | ICD-10-CM

## 2020-10-13 DIAGNOSIS — O0992 Supervision of high risk pregnancy, unspecified, second trimester: Secondary | ICD-10-CM

## 2020-10-13 DIAGNOSIS — Z3492 Encounter for supervision of normal pregnancy, unspecified, second trimester: Secondary | ICD-10-CM

## 2020-10-13 DIAGNOSIS — O10919 Unspecified pre-existing hypertension complicating pregnancy, unspecified trimester: Secondary | ICD-10-CM

## 2020-10-13 DIAGNOSIS — O2602 Excessive weight gain in pregnancy, second trimester: Secondary | ICD-10-CM

## 2020-10-13 NOTE — Progress Notes (Signed)
Patient stated that she will get headaches 1x or 2x a week

## 2020-10-15 MED ORDER — ASPIRIN EC 81 MG PO TBEC: 81.0000 mg | DELAYED_RELEASE_TABLET | Freq: Every day | ORAL | 2 refills | Status: DC

## 2020-10-15 NOTE — Progress Notes (Signed)
   PRENATAL VISIT NOTE  Subjective:  Veronica Townsend is a 24 y.o. G1P0 at [redacted]w[redacted]d being seen today for ongoing prenatal care.  She is currently monitored for the following issues for this high-risk pregnancy and has Supervision of low-risk first pregnancy; Chronic hypertension affecting pregnancy; and Alpha thalassemia silent carrier on their problem list.  Patient reports  headaches 1-2x/week, also concerned about her weight gain this pregnancy of 62lbs. Lost 60lbs in the 34mo before she got pregnant by following a keto diet and working out frequently. Is now eating a regular diet, but often only eating twice a day .  Contractions: Not present. Vag. Bleeding: None.  Movement: Present. Denies leaking of fluid.   The following portions of the patient's history were reviewed and updated as appropriate: allergies, current medications, past family history, past medical history, past social history, past surgical history and problem list.   Objective:   Vitals:   10/13/20 1448  BP: (!) 141/79  Pulse: 88  Weight: 231 lb 4.8 oz (104.9 kg)   Fetal Status: Fetal Heart Rate (bpm): 158 Fundal Height: 26 cm Movement: Present     General:  Alert, oriented and cooperative. Patient is in no acute distress.  Skin: Skin is warm and dry. No rash noted.   Cardiovascular: Normal heart rate noted  Respiratory: Normal respiratory effort, no problems with respiration noted  Abdomen: Soft, gravid, appropriate for gestational age.  Pain/Pressure: Present     Pelvic: Cervical exam deferred        Extremities: Normal range of motion.  Edema: Trace  Mental Status: Normal mood and affect. Normal behavior. Normal judgment and thought content.   Assessment and Plan:  Pregnancy: G1P0 at [redacted]w[redacted]d 1. Supervision of low-risk pregnancy, second trimester - Doing well and feeling regular fetal movement.   2. [redacted] weeks gestation of pregnancy - Routine OB care including anticipatory guidance re GTT at next visit  3. Excessive  weight gain during pregnancy in second trimester - Discussed how rebound weight often occurs after extreme diets/exercise because of the body interpreting the diet as starvation. Advised to start eating q3-4hrs with a protein rich, whole foods diet with plenty of hydration and we will watch her weight gain going forward.  4. Chronic hypertension - Had elevated BP in 2020, 2021, April 2022, and now several times in pregnancy. Meets criteria for chronic hypertension but not for medication yet. - Advised to watch for headaches that do not respond to food/hydration/rest or Tylenol - To start on 81mg  aspirin daily for preeclampsia prevention  Preterm labor symptoms and general obstetric precautions including but not limited to vaginal bleeding, contractions, leaking of fluid and fetal movement were reviewed in detail with the patient. Please refer to After Visit Summary for other counseling recommendations.   Return in about 2 weeks (around 10/27/2020) for IN-PERSON, LOB/GTT.  Future Appointments  Date Time Provider Department Center  10/22/2020  1:00 PM [facility] NURSE [facility]  10/22/2020  1:15 PM [facility] US2 [facility]  11/01/2020  3:55 PM 01/01/2021 Magnus Sinning, PA-C [facility]    [facility] CNM

## 2020-10-22 ENCOUNTER — Other Ambulatory Visit: Payer: Self-pay

## 2020-10-22 ENCOUNTER — Ambulatory Visit: Payer: Medicaid Other | Attending: Obstetrics and Gynecology

## 2020-10-22 ENCOUNTER — Other Ambulatory Visit: Payer: Self-pay | Admitting: *Deleted

## 2020-10-22 ENCOUNTER — Encounter: Payer: Self-pay | Admitting: *Deleted

## 2020-10-22 ENCOUNTER — Ambulatory Visit: Payer: Medicaid Other | Admitting: *Deleted

## 2020-10-22 VITALS — BP 137/60 | HR 81

## 2020-10-22 DIAGNOSIS — Z362 Encounter for other antenatal screening follow-up: Secondary | ICD-10-CM | POA: Diagnosis not present

## 2020-10-22 DIAGNOSIS — Z3402 Encounter for supervision of normal first pregnancy, second trimester: Secondary | ICD-10-CM | POA: Diagnosis present

## 2020-10-22 DIAGNOSIS — O10912 Unspecified pre-existing hypertension complicating pregnancy, second trimester: Secondary | ICD-10-CM | POA: Diagnosis present

## 2020-10-22 DIAGNOSIS — D563 Thalassemia minor: Secondary | ICD-10-CM | POA: Insufficient documentation

## 2020-10-22 DIAGNOSIS — O10012 Pre-existing essential hypertension complicating pregnancy, second trimester: Secondary | ICD-10-CM

## 2020-10-22 DIAGNOSIS — Z3A27 27 weeks gestation of pregnancy: Secondary | ICD-10-CM | POA: Diagnosis not present

## 2020-10-22 DIAGNOSIS — O10919 Unspecified pre-existing hypertension complicating pregnancy, unspecified trimester: Secondary | ICD-10-CM

## 2020-11-01 ENCOUNTER — Encounter: Payer: PRIVATE HEALTH INSURANCE | Admitting: Medical

## 2020-11-11 ENCOUNTER — Other Ambulatory Visit: Payer: Self-pay

## 2020-11-11 ENCOUNTER — Ambulatory Visit (INDEPENDENT_AMBULATORY_CARE_PROVIDER_SITE_OTHER): Payer: Medicaid Other | Admitting: Family Medicine

## 2020-11-11 VITALS — BP 135/76 | HR 88 | Wt 241.4 lb

## 2020-11-11 DIAGNOSIS — O10919 Unspecified pre-existing hypertension complicating pregnancy, unspecified trimester: Secondary | ICD-10-CM

## 2020-11-11 DIAGNOSIS — O0990 Supervision of high risk pregnancy, unspecified, unspecified trimester: Secondary | ICD-10-CM

## 2020-11-11 NOTE — Progress Notes (Signed)
   PRENATAL VISIT NOTE  Subjective:  Veronica Townsend is a 25 y.o. G1P0 at [redacted]w[redacted]d being seen today for ongoing prenatal care.  She is currently monitored for the following issues for this high-risk pregnancy and has Supervision of high risk pregnancy, antepartum; Chronic hypertension affecting pregnancy; and Alpha thalassemia silent carrier on their problem list.  Patient reports no complaints.  Contractions: Not present. Vag. Bleeding: None.  Movement: Present. Denies leaking of fluid.   The following portions of the patient's history were reviewed and updated as appropriate: allergies, current medications, past family history, past medical history, past social history, past surgical history and problem list.   Objective:   Vitals:   11/11/20 1438  BP: 135/76  Pulse: 88  Weight: 241 lb 6.4 oz (109.5 kg)    Fetal Status: Fetal Heart Rate (bpm): 156   Movement: Present     General:  Alert, oriented and cooperative. Patient is in no acute distress.  Skin: Skin is warm and dry. No rash noted.   Cardiovascular: Normal heart rate noted  Respiratory: Normal respiratory effort, no problems with respiration noted  Abdomen: Soft, gravid, appropriate for gestational age.  Pain/Pressure: Present     Pelvic: Cervical exam deferred        Extremities: Normal range of motion.  Edema: Trace  Mental Status: Normal mood and affect. Normal behavior. Normal judgment and thought content.   Assessment and Plan:  Pregnancy: G1P0 at [redacted]w[redacted]d 1. Supervision of high risk pregnancy, antepartum Plans on breastfeeding IP nexplanon IP circ  2. Chronic hypertension affecting pregnancy BP wnl  Negative PEC sx  Preterm labor symptoms and general obstetric precautions including but not limited to vaginal bleeding, contractions, leaking of fluid and fetal movement were reviewed in detail with the patient. Please refer to After Visit Summary for other counseling recommendations.   Return in about 3 weeks (around  12/02/2020) for Routine prenatal care.  Future Appointments  Date Time Provider Department Center  11/16/2020  8:50 AM [facility]  11/26/2020  1:30 PM [facility] NURSE [facility]  11/26/2020  1:45 PM [facility] US6 [facility]  12/02/2020  3:55 PM Adam Phenix, MD [facility]    Federico Flake, MD

## 2020-11-11 NOTE — Progress Notes (Signed)
Pt states has been having back paion but not taking any pain meds, advised can take Tylenol.

## 2020-11-11 NOTE — Progress Notes (Deleted)
   PRENATAL VISIT NOTE  Subjective:  Veronica Townsend is a 25 y.o. G1P0 at [redacted]w[redacted]d being seen today for ongoing prenatal care.  She is currently monitored for the following issues for this low-risk pregnancy and has Supervision of high risk pregnancy, antepartum; Chronic hypertension affecting pregnancy; and Alpha thalassemia silent carrier on their problem list.  Patient reports no complaints.  Contractions: Not present. Vag. Bleeding: None.  Movement: Present. Denies leaking of fluid.   The following portions of the patient's history were reviewed and updated as appropriate: allergies, current medications, past family history, past medical history, past social history, past surgical history and problem list.   Objective:   Vitals:   11/11/20 1438  BP: 135/76  Pulse: 88  Weight: 241 lb 6.4 oz (109.5 kg)    Fetal Status: Fetal Heart Rate (bpm): 156 Fundal Height: 29 cm Movement: Present     General:  Alert, oriented and cooperative. Patient is in no acute distress.  Skin: Skin is warm and dry. No rash noted.   Cardiovascular: Normal heart rate noted  Respiratory: Normal respiratory effort, no problems with respiration noted  Abdomen: Soft, gravid, appropriate for gestational age.  Pain/Pressure: Present     Pelvic: Cervical exam deferred        Extremities: Normal range of motion.  Edema: Trace  Mental Status: Normal mood and affect. Normal behavior. Normal judgment and thought content.   Assessment and Plan:  Pregnancy: G1P0 at [redacted]w[redacted]d 1. Supervision of high risk pregnancy, antepartum ***  2. Chronic hypertension affecting pregnancy ***  {Blank single:19197::"Term","Preterm"} labor symptoms and general obstetric precautions including but not limited to vaginal bleeding, contractions, leaking of fluid and fetal movement were reviewed in detail with the patient. Please refer to After Visit Summary for other counseling recommendations.   Return in about 3 weeks (around 12/02/2020)  for Routine prenatal care.  Future Appointments  Date Time Provider Department Center  11/26/2020  1:30 PM [facility]URSE [facility]  11/26/2020  1:45 PM [facility] US6 [facility]  12/02/2020  3:55 PM Adam Phenix, MD [facility]    Federico Flake, MD

## 2020-11-16 ENCOUNTER — Other Ambulatory Visit: Payer: Medicaid Other

## 2020-11-16 ENCOUNTER — Other Ambulatory Visit: Payer: Self-pay

## 2020-11-16 DIAGNOSIS — O0990 Supervision of high risk pregnancy, unspecified, unspecified trimester: Secondary | ICD-10-CM

## 2020-11-17 LAB — CBC
Hematocrit: 33.5 % — ABNORMAL LOW (ref 34.0–46.6)
Hemoglobin: 11 g/dL — ABNORMAL LOW (ref 11.1–15.9)
MCH: 26.4 pg — ABNORMAL LOW (ref 26.6–33.0)
MCHC: 32.8 g/dL (ref 31.5–35.7)
MCV: 81 fL (ref 79–97)
Platelets: 244 10*3/uL (ref 150–450)
RBC: 4.16 x10E6/uL (ref 3.77–5.28)
RDW: 11.2 % — ABNORMAL LOW (ref 11.7–15.4)
WBC: 9.8 10*3/uL (ref 3.4–10.8)

## 2020-11-17 LAB — GLUCOSE TOLERANCE, 2 HOURS W/ 1HR
Glucose, 1 hour: 135 mg/dL (ref 70–179)
Glucose, 2 hour: 102 mg/dL (ref 70–152)
Glucose, Fasting: 89 mg/dL (ref 70–91)

## 2020-11-17 LAB — RPR: RPR Ser Ql: NONREACTIVE

## 2020-11-17 LAB — HIV ANTIBODY (ROUTINE TESTING W REFLEX): HIV Screen 4th Generation wRfx: NONREACTIVE

## 2020-11-26 ENCOUNTER — Ambulatory Visit: Payer: Medicaid Other | Attending: Obstetrics

## 2020-11-26 ENCOUNTER — Other Ambulatory Visit: Payer: Self-pay

## 2020-11-26 ENCOUNTER — Other Ambulatory Visit: Payer: Self-pay | Admitting: *Deleted

## 2020-11-26 ENCOUNTER — Encounter: Payer: Self-pay | Admitting: *Deleted

## 2020-11-26 ENCOUNTER — Ambulatory Visit: Payer: Medicaid Other | Admitting: *Deleted

## 2020-11-26 VITALS — BP 125/70 | HR 91

## 2020-11-26 DIAGNOSIS — O0990 Supervision of high risk pregnancy, unspecified, unspecified trimester: Secondary | ICD-10-CM | POA: Diagnosis present

## 2020-11-26 DIAGNOSIS — O10919 Unspecified pre-existing hypertension complicating pregnancy, unspecified trimester: Secondary | ICD-10-CM | POA: Diagnosis present

## 2020-11-26 DIAGNOSIS — Z3A32 32 weeks gestation of pregnancy: Secondary | ICD-10-CM

## 2020-11-26 DIAGNOSIS — O10913 Unspecified pre-existing hypertension complicating pregnancy, third trimester: Secondary | ICD-10-CM

## 2020-11-26 DIAGNOSIS — O10013 Pre-existing essential hypertension complicating pregnancy, third trimester: Secondary | ICD-10-CM | POA: Diagnosis not present

## 2020-11-26 DIAGNOSIS — D563 Thalassemia minor: Secondary | ICD-10-CM | POA: Insufficient documentation

## 2020-11-26 DIAGNOSIS — Z362 Encounter for other antenatal screening follow-up: Secondary | ICD-10-CM | POA: Diagnosis not present

## 2020-12-02 ENCOUNTER — Ambulatory Visit (INDEPENDENT_AMBULATORY_CARE_PROVIDER_SITE_OTHER): Payer: Medicaid Other | Admitting: Obstetrics & Gynecology

## 2020-12-02 ENCOUNTER — Other Ambulatory Visit: Payer: Self-pay

## 2020-12-02 VITALS — BP 136/63 | HR 92 | Wt 247.0 lb

## 2020-12-02 DIAGNOSIS — O10919 Unspecified pre-existing hypertension complicating pregnancy, unspecified trimester: Secondary | ICD-10-CM

## 2020-12-02 DIAGNOSIS — O0990 Supervision of high risk pregnancy, unspecified, unspecified trimester: Secondary | ICD-10-CM

## 2020-12-02 NOTE — Progress Notes (Signed)
   PRENATAL VISIT NOTE  Subjective:  Veronica Townsend is a 25 y.o. G1P0 at [redacted]w[redacted]d being seen today for ongoing prenatal care.  She is currently monitored for the following issues for this high-risk pregnancy and has Supervision of high risk pregnancy, antepartum; Chronic hypertension affecting pregnancy; and Alpha thalassemia silent carrier on their problem list.  Patient reports no complaints.  Contractions: Irritability. Vag. Bleeding: None.  Movement: Present. Denies leaking of fluid.   The following portions of the patient's history were reviewed and updated as appropriate: allergies, current medications, past family history, past medical history, past social history, past surgical history and problem list.   Objective:   Vitals:   12/02/20 1619  BP: 136/63  Pulse: 92  Weight: 247 lb (112 kg)    Fetal Status: Fetal Heart Rate (bpm): 146   Movement: Present     General:  Alert, oriented and cooperative. Patient is in no acute distress.  Skin: Skin is warm and dry. No rash noted.   Cardiovascular: Normal heart rate noted  Respiratory: Normal respiratory effort, no problems with respiration noted  Abdomen: Soft, gravid, appropriate for gestational age.  Pain/Pressure: Present     Pelvic: Cervical exam deferred        Extremities: Normal range of motion.  Edema: Trace  Mental Status: Normal mood and affect. Normal behavior. Normal judgment and thought content.   Assessment and Plan:  Pregnancy: G1P0 at [redacted]w[redacted]d 1. Supervision of high risk pregnancy, antepartum Normal growth  2. Chronic hypertension affecting pregnancy  BP ok without meds  Preterm labor symptoms and general obstetric precautions including but not limited to vaginal bleeding, contractions, leaking of fluid and fetal movement were reviewed in detail with the patient. Please refer to After Visit Summary for other counseling recommendations.   Return in about 2 weeks (around 12/16/2020).  Future Appointments  Date  Time Provider Department Center  12/20/2020  3:35 PM Berle Mull [facility]  12/27/2020  1:30 PM [facility] NURSE [facility]  12/27/2020  1:45 PM [facility] US4 [facility]    Scheryl Darter, MD

## 2020-12-20 ENCOUNTER — Other Ambulatory Visit: Payer: Self-pay

## 2020-12-20 ENCOUNTER — Ambulatory Visit (INDEPENDENT_AMBULATORY_CARE_PROVIDER_SITE_OTHER): Payer: Medicaid Other | Admitting: Family Medicine

## 2020-12-20 VITALS — BP 134/69 | HR 92 | Wt 258.0 lb

## 2020-12-20 DIAGNOSIS — O0990 Supervision of high risk pregnancy, unspecified, unspecified trimester: Secondary | ICD-10-CM

## 2020-12-20 DIAGNOSIS — O10919 Unspecified pre-existing hypertension complicating pregnancy, unspecified trimester: Secondary | ICD-10-CM

## 2020-12-20 NOTE — Progress Notes (Signed)
    PRENATAL VISIT NOTE  Subjective:  Veronica Townsend is a 25 y.o. G1P0 at [redacted]w[redacted]d being seen today for ongoing prenatal care.  She is currently monitored for the following issues for this high-risk pregnancy and has Supervision of high risk pregnancy, antepartum; Chronic hypertension affecting pregnancy; and Alpha thalassemia silent carrier on their problem list.  Patient reports no complaints.  Contractions: Not present. Vag. Bleeding: None.  Movement: Present. Denies leaking of fluid.   The following portions of the patient's history were reviewed and updated as appropriate: allergies, current medications, past family history, past medical history, past social history, past surgical history and problem list.   Objective:   Vitals:   12/20/20 1600  BP: 134/69  Pulse: 92  Weight: 258 lb (117 kg)    Fetal Status: Fetal Heart Rate (bpm): 146 Fundal Height: 34 cm Movement: Present     General:  Alert, oriented and cooperative. Patient is in no acute distress.  Skin: Skin is warm and dry. No rash noted.   Cardiovascular: Normal heart rate noted  Respiratory: Normal respiratory effort, no problems with respiration noted  Abdomen: Soft, gravid, appropriate for gestational age.  Pain/Pressure: Present     Pelvic: Cervical exam deferred        Extremities: Normal range of motion.  Edema: Trace  Mental Status: Normal mood and affect. Normal behavior. Normal judgment and thought content.   Assessment and Plan:  Pregnancy: G1P0 at [redacted]w[redacted]d 1. Chronic hypertension affecting pregnancy BP is well controlled on no meds On ASA Appropriate growth--f/u next week  2. Supervision of high risk pregnancy, antepartum Continue prenatal care. Cultures next week  Preterm labor symptoms and general obstetric precautions including but not limited to vaginal bleeding, contractions, leaking of fluid and fetal movement were reviewed in detail with the patient. Please refer to After Visit Summary for other  counseling recommendations.   Return in 1 week (on 12/27/2020) for [facility].  Future Appointments  Date Time Provider Department Center  12/27/2020  1:30 PM [facility] NURSE [facility]  12/27/2020  1:45 PM [facility] US4 [facility]  12/30/2020  3:15 PM Anyanwu, Jethro Bastos, MD [facility]    Reva Bores, MD

## 2020-12-20 NOTE — Progress Notes (Signed)
Patient reports pain in ribs and lower back

## 2020-12-21 ENCOUNTER — Encounter: Payer: Self-pay | Admitting: Family Medicine

## 2020-12-27 ENCOUNTER — Encounter: Payer: Self-pay | Admitting: *Deleted

## 2020-12-27 ENCOUNTER — Ambulatory Visit: Payer: Medicaid Other | Admitting: *Deleted

## 2020-12-27 ENCOUNTER — Other Ambulatory Visit: Payer: Self-pay

## 2020-12-27 ENCOUNTER — Ambulatory Visit: Payer: Medicaid Other | Attending: Obstetrics and Gynecology

## 2020-12-27 VITALS — BP 135/67 | HR 94

## 2020-12-27 DIAGNOSIS — O10013 Pre-existing essential hypertension complicating pregnancy, third trimester: Secondary | ICD-10-CM | POA: Diagnosis not present

## 2020-12-27 DIAGNOSIS — D563 Thalassemia minor: Secondary | ICD-10-CM | POA: Insufficient documentation

## 2020-12-27 DIAGNOSIS — Z3A36 36 weeks gestation of pregnancy: Secondary | ICD-10-CM | POA: Diagnosis not present

## 2020-12-27 DIAGNOSIS — O0990 Supervision of high risk pregnancy, unspecified, unspecified trimester: Secondary | ICD-10-CM

## 2020-12-27 DIAGNOSIS — O10913 Unspecified pre-existing hypertension complicating pregnancy, third trimester: Secondary | ICD-10-CM | POA: Insufficient documentation

## 2020-12-30 ENCOUNTER — Other Ambulatory Visit (HOSPITAL_COMMUNITY)
Admission: RE | Admit: 2020-12-30 | Discharge: 2020-12-30 | Disposition: A | Discharge status: Home / Self Care | Payer: Medicaid Other | Source: Ambulatory Visit | Attending: Obstetrics & Gynecology | Admitting: Obstetrics & Gynecology

## 2020-12-30 ENCOUNTER — Other Ambulatory Visit: Payer: Self-pay

## 2020-12-30 ENCOUNTER — Encounter: Payer: Self-pay | Admitting: Obstetrics & Gynecology

## 2020-12-30 ENCOUNTER — Ambulatory Visit (INDEPENDENT_AMBULATORY_CARE_PROVIDER_SITE_OTHER): Payer: Medicaid Other | Admitting: Obstetrics & Gynecology

## 2020-12-30 VITALS — BP 144/75 | HR 83 | Wt 257.8 lb

## 2020-12-30 DIAGNOSIS — O0990 Supervision of high risk pregnancy, unspecified, unspecified trimester: Secondary | ICD-10-CM | POA: Insufficient documentation

## 2020-12-30 DIAGNOSIS — O10919 Unspecified pre-existing hypertension complicating pregnancy, unspecified trimester: Secondary | ICD-10-CM | POA: Diagnosis not present

## 2020-12-30 DIAGNOSIS — Z3A36 36 weeks gestation of pregnancy: Secondary | ICD-10-CM

## 2020-12-30 LAB — OB RESULTS CONSOLE GC/CHLAMYDIA: Gonorrhea: NEGATIVE

## 2020-12-30 NOTE — Patient Instructions (Addendum)
Return to office for any scheduled appointments. Call the office or go to the [facility] [place] if:  You begin to have strong, frequent contractions  Your water breaks.  Sometimes it is a big gush of fluid, sometimes it is just a trickle that keeps getting your panties wet or running down your legs  You have vaginal bleeding.  It is normal to have a small amount of spotting if your cervix was checked.   You do not feel your baby moving like normal.  If you do not, get something to eat and drink and lay down and focus on feeling your baby move.   If your baby is still not moving like normal, you should call the office or go to MAU.  Any other obstetric concerns.   

## 2020-12-30 NOTE — Progress Notes (Signed)
PRENATAL VISIT NOTE  Subjective:  Veronica Townsend is a 25 y.o. G1P0 at [redacted]w[redacted]d being seen today for ongoing prenatal care.  She is currently monitored for the following issues for this high-risk pregnancy and has Supervision of high risk pregnancy, antepartum; Chronic hypertension affecting pregnancy; and Alpha thalassemia silent carrier on their problem list.  Patient reports no complaints. Patient denies any headaches, visual symptoms, RUQ/epigastric pain or other concerning symptoms.  Contractions: Irritability. Vag. Bleeding: None.  Movement: Present. Denies leaking of fluid.   The following portions of the patient's history were reviewed and updated as appropriate: allergies, current medications, past family history, past medical history, past social history, past surgical history and problem list.   Objective:   Vitals:   12/30/20 1536  BP: (!) 144/75  Pulse: 83  Weight: 257 lb 12.8 oz (116.9 kg)    Fetal Status: Fetal Heart Rate (bpm): 155   Movement: Present     General:  Alert, oriented and cooperative. Patient is in no acute distress.  Skin: Skin is warm and dry. No rash noted.   Cardiovascular: Normal heart rate noted  Respiratory: Normal respiratory effort, no problems with respiration noted  Abdomen: Soft, gravid, appropriate for gestational age.  Pain/Pressure: Absent     Pelvic: Pelvic cultures done, cervical check deferred.  Exam performed in the presence of a chaperone        Extremities: Normal range of motion.  Edema: None  Mental Status: Normal mood and affect. Normal behavior. Normal judgment and thought content.   Imaging: [place] MFM OB FOLLOW UP  Result Date: 12/27/2020 ----------------------------------------------------------------------  OBSTETRICS REPORT                       (Signed Final 12/27/2020 04:39 pm) ---------------------------------------------------------------------- Patient Info  ID #:       [id number]                          D.O.B.:  1996/01/12  (25 yrs)  Name:       Veronica Townsend                 Visit Date: 12/27/2020 02:13 pm ---------------------------------------------------------------------- Performed By  Attending:        Tama High MD        Secondary Phy.:   [facility]                                                             [facility]  Performed By:     Rodrigo Ran BS      Address:          [address]                                                             [place], [place]  [address]  Referred By:      Annice Needy              Location:         [facility]                    ECKSTAT MD                               [facility] at                                                             [place] [facility]                                                             [facility]  Ref. Address:     [address] [place]                    [place], [place] ---------------------------------------------------------------------- Orders  #  Description                           Code        Ordered By  1  [place] MFM OB FOLLOW UP                   [id number]    Tama High ----------------------------------------------------------------------  #  Order #                     Accession #                Episode #  1  [id number]                   [id number]                 [id number] ---------------------------------------------------------------------- Indications  Hypertension - Chronic/Pre-existing (no        O10.019  meds)  Genetic carrier (Silent Alpha Thal)            Z14.8  Encounter for other antenatal screening        Z36.2  follow-up  LR NIPS/ Negative AFP  [redacted] weeks gestation of pregnancy                Z3A.36 ---------------------------------------------------------------------- Fetal Evaluation  Num Of Fetuses:         1  Fetal Heart Rate(bpm):  135  Cardiac Activity:       Observed  Presentation:            Cephalic  Placenta:               Posterior Fundal  P. Cord Insertion:      Previously Visualized  Amniotic  Fluid  AFI FV:      Within normal limits  AFI Sum(cm)     %Tile       Largest Pocket(cm)  11.8            36          4.6  RUQ(cm)                     LUQ(cm)        LLQ(cm)  4.5                         2.7            4.6 ---------------------------------------------------------------------- Biometry  BPD:      86.1  mm     G. Age:  34w 5d         16  %    CI:         75.3   %    70 - 86                                                          FL/HC:      22.4   %    20.1 - 22.1  HC:      314.7  mm     G. Age:  35w 2d          6  %    HC/AC:      0.96        0.93 - 1.11  AC:      327.9  mm     G. Age:  36w 5d         70  %    FL/BPD:     82.0   %    71 - 87  FL:       70.6  mm     G. Age:  36w 1d         40  %    FL/AC:      21.5   %    20 - 24  LV:          6  mm  Est. FW:    2873  gm      6 lb 5 oz     46  % ---------------------------------------------------------------------- OB History  Blood Type:   AB+  Gravidity:    1         Term:   0        Prem:   0        SAB:   0  TOP:          0       Ectopic:  0        Living: 0 ---------------------------------------------------------------------- Gestational Age  LMP:           42w 0d        Date:  03/08/20                 EDD:   12/13/20  U/S Today:     35w 5d  EDD:   01/26/21  Best:          36w 3d     Det. By:  Previous Ultrasound      EDD:   01/21/21                                      (05/25/20) ---------------------------------------------------------------------- Anatomy  Cranium:               Appears normal         LVOT:                   Appears normal  Cavum:                 Appears normal         Aortic Arch:            Previously seen  Ventricles:            Appears normal         Ductal Arch:            Previously seen  Choroid Plexus:        Previously seen        Diaphragm:              Previously seen   Cerebellum:            Previously seen        Stomach:                Appears normal, left                                                                        sided  Posterior Fossa:       Previously seen        Abdomen:                Previously seen  Nuchal Fold:           Previously seen        Abdominal Wall:         Previously seen  Face:                  Orbits and profile     Cord Vessels:           Previously seen                         previously seen  Lips:                  Previously seen        Kidneys:                Appear normal  Palate:                Previously seen        Bladder:                Appears normal  Thoracic:              Appears normal  Spine:                  Previously seen  Heart:                 Previously seen        Upper Extremities:      Previously seen  RVOT:                  Appears normal         Lower Extremities:      Previously seen  Other:  Female gender previously visualized.  Heels/feet and open hands/5th          digits previously visualized. 3VV, 3VT, VC previously visualized.          Technically difficult due to fetal position. ---------------------------------------------------------------------- Cervix Uterus Adnexa  Cervix  Not visualized (advanced GA >24wks)  Uterus  No abnormality visualized.  Right Ovary  Not visualized.  Left Ovary  Within normal limits.  Cul De Sac  No free fluid seen.  Adnexa  No abnormality visualized. ---------------------------------------------------------------------- Impression  Amniotic fluid is normal and good fetal activity is seen .Fetal  growth is appropriate for gestational age .  Chronic hypertension. Well-controlled without  antihypertensives . ---------------------------------------------------------------------- Recommendations  Follow-up scans as clinically indicated. ----------------------------------------------------------------------                  Tama High, MD Electronically Signed Final Report    12/27/2020 04:39 pm ----------------------------------------------------------------------   Assessment and Plan:  Pregnancy: G1P0 at [redacted]w[redacted]d 1. Chronic hypertension affecting pregnancy Mildly elevated SBP. No PEC symptoms.  Will check labs today. If stable, can be delivered latest at 39 weeks. May need earlier delivery for signs/symptoms of worsening BP or PEC.  - Comprehensive metabolic panel - CBC - Protein / creatinine ratio, urine  2. [redacted] weeks gestation of pregnancy 3. Supervision of high risk pregnancy, antepartum Pelvic cultures done today. - GC/Chlamydia probe amp ([place])not at [facility] - Culture, beta strep (group b only)  Preterm labor symptoms and general obstetric precautions including but not limited to vaginal bleeding, contractions, leaking of fluid and fetal movement were reviewed in detail with the patient. Please refer to After Visit Summary for other counseling recommendations.   Return in about 1 week (around 01/06/2021) for OFFICE OB VISIT (MD only).  No future appointments.  Verita Schneiders, MD

## 2020-12-31 LAB — PROTEIN / CREATININE RATIO, URINE
Creatinine, Urine: 120 mg/dL
Protein, Ur: 21.2 mg/dL
Protein/Creat Ratio: 177 mg/g creat (ref 0–200)

## 2020-12-31 LAB — COMPREHENSIVE METABOLIC PANEL
ALT: 10 IU/L (ref 0–32)
AST: 14 IU/L (ref 0–40)
Albumin/Globulin Ratio: 1.2 (ref 1.2–2.2)
Albumin: 3.7 g/dL — ABNORMAL LOW (ref 3.9–5.0)
Alkaline Phosphatase: 168 IU/L — ABNORMAL HIGH (ref 44–121)
BUN/Creatinine Ratio: 20 (ref 9–23)
BUN: 10 mg/dL (ref 6–20)
Bilirubin Total: <0.2 mg/dL (ref 0.0–1.2)
CO2: 21 mmol/L (ref 20–29)
Calcium: 9.3 mg/dL (ref 8.7–10.2)
Chloride: 105 mmol/L (ref 96–106)
Creatinine, Ser: 0.51 mg/dL — ABNORMAL LOW (ref 0.57–1.00)
Globulin, Total: 3 g/dL (ref 1.5–4.5)
Glucose: 82 mg/dL (ref 70–99)
Potassium: 4.4 mmol/L (ref 3.5–5.2)
Sodium: 137 mmol/L (ref 134–144)
Total Protein: 6.7 g/dL (ref 6.0–8.5)
eGFR: 134 mL/min/{1.73_m2} (ref >59)

## 2020-12-31 LAB — CBC
Hematocrit: 32.8 % — ABNORMAL LOW (ref 34.0–46.6)
Hemoglobin: 10.7 g/dL — ABNORMAL LOW (ref 11.1–15.9)
MCH: 26.2 pg — ABNORMAL LOW (ref 26.6–33.0)
MCHC: 32.6 g/dL (ref 31.5–35.7)
MCV: 80 fL (ref 79–97)
Platelets: 247 10*3/uL (ref 150–450)
RBC: 4.08 x10E6/uL (ref 3.77–5.28)
RDW: 12.2 % (ref 11.7–15.4)
WBC: 10.9 10*3/uL — ABNORMAL HIGH (ref 3.4–10.8)

## 2020-12-31 LAB — GC/CHLAMYDIA PROBE AMP (~~LOC~~) NOT AT ARMC
Chlamydia: NEGATIVE
Comment: NEGATIVE
Comment: NORMAL
Neisseria Gonorrhea: NEGATIVE

## 2021-01-02 ENCOUNTER — Encounter: Payer: Self-pay | Admitting: Obstetrics & Gynecology

## 2021-01-02 DIAGNOSIS — O99820 Streptococcus B carrier state complicating pregnancy: Secondary | ICD-10-CM | POA: Insufficient documentation

## 2021-01-02 LAB — CULTURE, BETA STREP (GROUP B ONLY): Strep Gp B Culture: POSITIVE — AB

## 2021-01-06 ENCOUNTER — Ambulatory Visit (INDEPENDENT_AMBULATORY_CARE_PROVIDER_SITE_OTHER): Payer: Medicaid Other | Admitting: Family Medicine

## 2021-01-06 ENCOUNTER — Other Ambulatory Visit (HOSPITAL_COMMUNITY): Payer: Self-pay | Admitting: Advanced Practice Midwife

## 2021-01-06 ENCOUNTER — Encounter: Payer: Self-pay | Admitting: Family Medicine

## 2021-01-06 ENCOUNTER — Other Ambulatory Visit: Payer: Self-pay

## 2021-01-06 VITALS — BP 138/74 | HR 93 | Wt 260.9 lb

## 2021-01-06 DIAGNOSIS — O10919 Unspecified pre-existing hypertension complicating pregnancy, unspecified trimester: Secondary | ICD-10-CM

## 2021-01-06 DIAGNOSIS — O0990 Supervision of high risk pregnancy, unspecified, unspecified trimester: Secondary | ICD-10-CM

## 2021-01-06 DIAGNOSIS — D563 Thalassemia minor: Secondary | ICD-10-CM

## 2021-01-06 DIAGNOSIS — O99820 Streptococcus B carrier state complicating pregnancy: Secondary | ICD-10-CM

## 2021-01-06 NOTE — Progress Notes (Signed)
   PRENATAL VISIT NOTE  Subjective:  Veronica Townsend is a 25 y.o. G1P0 at [redacted]w[redacted]d being seen today for ongoing prenatal care.  She is currently monitored for the following issues for this high-risk pregnancy and has Supervision of high risk pregnancy, antepartum; Chronic hypertension affecting pregnancy; Alpha thalassemia silent carrier; and Group B Streptococcus carrier, +RV culture, currently pregnant on their problem list.  Patient reports no complaints.  Contractions: Not present. Vag. Bleeding: None.  Movement: Present. Denies leaking of fluid.   The following portions of the patient's history were reviewed and updated as appropriate: allergies, current medications, past family history, past medical history, past social history, past surgical history and problem list.   Objective:   Vitals:   01/06/21 1514  BP: 138/74  Pulse: 93  Weight: 260 lb 14.4 oz (118.3 kg)    Fetal Status: Fetal Heart Rate (bpm): 153 Fundal Height: 37 cm Movement: Present     General:  Alert, oriented and cooperative. Patient is in no acute distress.  Skin: Skin is warm and dry. No rash noted.   Cardiovascular: Normal heart rate noted  Respiratory: Normal respiratory effort, no problems with respiration noted  Abdomen: Soft, gravid, appropriate for gestational age.  Pain/Pressure: Absent     Pelvic: Cervical exam deferred        Extremities: Normal range of motion.  Edema: None  Mental Status: Normal mood and affect. Normal behavior. Normal judgment and thought content.   Assessment and Plan:  Pregnancy: G1P0 at [redacted]w[redacted]d 1. Supervision of high risk pregnancy, antepartum Up to date  2. Chronic hypertension affecting pregnancy BP is well controlled IOl at 39 wks-- requested and scheduled for 12/16  3. Group B Streptococcus carrier, +RV culture, currently pregnant PCN in labor  4. Alpha thalassemia silent carrier  Term labor symptoms and general obstetric precautions including but not limited to  vaginal bleeding, contractions, leaking of fluid and fetal movement were reviewed in detail with the patient. Please refer to After Visit Summary for other counseling recommendations.   Return in about 1 week (around 01/13/2021) for Routine prenatal care, MD or APP.  Future Appointments  Date Time Provider Department Center  01/14/2021  7:30 AM [facility] SCHED ROOM [facility] None  01/14/2021 10:35 AM Bernerd Limbo, CNM [facility]    Federico Flake, MD

## 2021-01-14 ENCOUNTER — Other Ambulatory Visit: Payer: Self-pay

## 2021-01-14 ENCOUNTER — Encounter: Payer: Medicaid Other | Admitting: Certified Nurse Midwife

## 2021-01-14 ENCOUNTER — Inpatient Hospital Stay (HOSPITAL_COMMUNITY): Payer: Medicaid Other

## 2021-01-14 ENCOUNTER — Encounter (HOSPITAL_COMMUNITY): Payer: Self-pay | Admitting: Family Medicine

## 2021-01-14 ENCOUNTER — Inpatient Hospital Stay (HOSPITAL_COMMUNITY)
Admission: AD | Admit: 2021-01-14 | Discharge: 2021-01-17 | DRG: 807 | Disposition: A | Discharge status: Home / Self Care | Payer: Medicaid Other | Attending: Obstetrics and Gynecology | Admitting: Obstetrics and Gynecology

## 2021-01-14 DIAGNOSIS — Z20822 Contact with and (suspected) exposure to covid-19: Secondary | ICD-10-CM | POA: Diagnosis present

## 2021-01-14 DIAGNOSIS — D563 Thalassemia minor: Secondary | ICD-10-CM | POA: Diagnosis not present

## 2021-01-14 DIAGNOSIS — Z3A39 39 weeks gestation of pregnancy: Secondary | ICD-10-CM | POA: Diagnosis not present

## 2021-01-14 DIAGNOSIS — O1002 Pre-existing essential hypertension complicating childbirth: Principal | ICD-10-CM | POA: Diagnosis present

## 2021-01-14 DIAGNOSIS — O0990 Supervision of high risk pregnancy, unspecified, unspecified trimester: Secondary | ICD-10-CM

## 2021-01-14 DIAGNOSIS — O99820 Streptococcus B carrier state complicating pregnancy: Secondary | ICD-10-CM

## 2021-01-14 DIAGNOSIS — O10919 Unspecified pre-existing hypertension complicating pregnancy, unspecified trimester: Secondary | ICD-10-CM | POA: Diagnosis present

## 2021-01-14 DIAGNOSIS — Z30017 Encounter for initial prescription of implantable subdermal contraceptive: Secondary | ICD-10-CM | POA: Diagnosis not present

## 2021-01-14 DIAGNOSIS — O99824 Streptococcus B carrier state complicating childbirth: Secondary | ICD-10-CM | POA: Diagnosis not present

## 2021-01-14 DIAGNOSIS — O164 Unspecified maternal hypertension, complicating childbirth: Secondary | ICD-10-CM | POA: Diagnosis not present

## 2021-01-14 HISTORY — DX: Essential (primary) hypertension: I10

## 2021-01-14 LAB — COMPREHENSIVE METABOLIC PANEL
ALT: 13 U/L (ref 0–44)
AST: 14 U/L — ABNORMAL LOW (ref 15–41)
Albumin: 2.9 g/dL — ABNORMAL LOW (ref 3.5–5.0)
Alkaline Phosphatase: 162 U/L — ABNORMAL HIGH (ref 38–126)
Anion gap: 5 (ref 5–15)
BUN: 10 mg/dL (ref 6–20)
CO2: 20 mmol/L — ABNORMAL LOW (ref 22–32)
Calcium: 9.1 mg/dL (ref 8.9–10.3)
Chloride: 107 mmol/L (ref 98–111)
Creatinine, Ser: 0.44 mg/dL (ref 0.44–1.00)
GFR, Estimated: >60 mL/min (ref >60)
Glucose, Bld: 88 mg/dL (ref 70–99)
Potassium: 3.9 mmol/L (ref 3.5–5.1)
Sodium: 132 mmol/L — ABNORMAL LOW (ref 135–145)
Total Bilirubin: 0.2 mg/dL — ABNORMAL LOW (ref 0.3–1.2)
Total Protein: 6.6 g/dL (ref 6.5–8.1)

## 2021-01-14 LAB — CBC
HCT: 32.6 % — ABNORMAL LOW (ref 36.0–46.0)
Hemoglobin: 10.2 g/dL — ABNORMAL LOW (ref 12.0–15.0)
MCH: 26 pg (ref 26.0–34.0)
MCHC: 31.3 g/dL (ref 30.0–36.0)
MCV: 83.2 fL (ref 80.0–100.0)
Platelets: 227 10*3/uL (ref 150–400)
RBC: 3.92 MIL/uL (ref 3.87–5.11)
RDW: 13.4 % (ref 11.5–15.5)
WBC: 10.8 10*3/uL — ABNORMAL HIGH (ref 4.0–10.5)
nRBC: 0 % (ref 0.0–0.2)

## 2021-01-14 LAB — PROTEIN / CREATININE RATIO, URINE
Creatinine, Urine: 57.04 mg/dL
Protein Creatinine Ratio: 0.19 mg/mg{Cre} — ABNORMAL HIGH (ref 0.00–0.15)
Total Protein, Urine: 11 mg/dL

## 2021-01-14 LAB — RESP PANEL BY RT-PCR (FLU A&B, COVID) ARPGX2
Influenza A by PCR: NEGATIVE
Influenza B by PCR: NEGATIVE
SARS Coronavirus 2 by RT PCR: NEGATIVE

## 2021-01-14 LAB — TYPE AND SCREEN
ABO/RH(D): AB POS
Antibody Screen: NEGATIVE

## 2021-01-14 MED ORDER — MISOPROSTOL 25 MCG QUARTER TABLET
25.0000 ug | ORAL_TABLET | ORAL | Status: DC | PRN
  Administered 2021-01-14 (×2): 25 ug via VAGINAL
  Filled 2021-01-14 (×2): qty 1

## 2021-01-14 MED ORDER — SODIUM CHLORIDE 0.9 % IV SOLN
5.0000 10*6.[IU] | Freq: Once | INTRAVENOUS | Status: AC
  Administered 2021-01-14: 5 10*6.[IU] via INTRAVENOUS
  Filled 2021-01-14: qty 5

## 2021-01-14 MED ORDER — ONDANSETRON HCL 4 MG/2ML IJ SOLN: 4.0000 mg | Freq: Four times a day (QID) | INTRAMUSCULAR | Status: DC | PRN

## 2021-01-14 MED ORDER — OXYTOCIN BOLUS FROM INFUSION
333.0000 mL | Freq: Once | INTRAVENOUS | Status: AC
  Administered 2021-01-16: 10:00:00 333 mL via INTRAVENOUS

## 2021-01-14 MED ORDER — ACETAMINOPHEN 325 MG PO TABS: 650.0000 mg | ORAL_TABLET | ORAL | Status: DC | PRN

## 2021-01-14 MED ORDER — SOD CITRATE-CITRIC ACID 500-334 MG/5ML PO SOLN: 30.0000 mL | ORAL | Status: DC | PRN

## 2021-01-14 MED ORDER — OXYTOCIN-SODIUM CHLORIDE 30-0.9 UT/500ML-% IV SOLN: 2.5000 [IU]/h | INTRAVENOUS | Status: DC

## 2021-01-14 MED ORDER — LIDOCAINE HCL (PF) 1 % IJ SOLN
30.0000 mL | INTRAMUSCULAR | Status: AC | PRN
  Administered 2021-01-16: 10:00:00 30 mL via SUBCUTANEOUS
  Filled 2021-01-14: qty 30

## 2021-01-14 MED ORDER — MISOPROSTOL 50MCG HALF TABLET
50.0000 ug | ORAL_TABLET | ORAL | Status: DC | PRN
  Administered 2021-01-14 – 2021-01-15 (×2): 50 ug via BUCCAL
  Filled 2021-01-14 (×2): qty 1

## 2021-01-14 MED ORDER — OXYCODONE-ACETAMINOPHEN 5-325 MG PO TABS: 2.0000 | ORAL_TABLET | ORAL | Status: DC | PRN

## 2021-01-14 MED ORDER — OXYCODONE-ACETAMINOPHEN 5-325 MG PO TABS: 1.0000 | ORAL_TABLET | ORAL | Status: DC | PRN

## 2021-01-14 MED ORDER — PENICILLIN G POT IN DEXTROSE 60000 UNIT/ML IV SOLN
3.0000 10*6.[IU] | INTRAVENOUS | Status: DC
  Administered 2021-01-14 – 2021-01-16 (×10): 3 10*6.[IU] via INTRAVENOUS
  Filled 2021-01-14 (×10): qty 50

## 2021-01-14 MED ORDER — TERBUTALINE SULFATE 1 MG/ML IJ SOLN: 0.2500 mg | Freq: Once | INTRAMUSCULAR | Status: DC | PRN

## 2021-01-14 MED ORDER — LACTATED RINGERS IV SOLN: INTRAVENOUS | Status: DC

## 2021-01-14 MED ORDER — LACTATED RINGERS IV SOLN
500.0000 mL | INTRAVENOUS | Status: DC | PRN
  Administered 2021-01-16: 02:00:00 500 mL via INTRAVENOUS

## 2021-01-14 NOTE — H&P (Addendum)
OBSTETRIC ADMISSION HISTORY AND PHYSICAL  Veronica Townsend is a 25 y.o. female G1P0 with IUP at 39w0dby early ultrasound presenting for IOL for cHTN. She denies contractions, leaking fluid, or vaginal bleeding. No headache, vision changes, peripheral edema, or RUQ/epigastric pain. She reports active fetal movement. She plans on breast feeding. She request Nexplanon for birth control. She [facility] her [facility] care at  [facility]   Dating: By 5 week ultrasound --->  Estimated Date of Delivery: 01/21/21  Sono:    _0 , CWD, normal anatomy, variable presentation, posterior fundal placenta, 254g, 30% EFW  _1 , CWD, normal anatomy, cephalic presentation, posterior fundal placenta, 2873g, 46% EFW  Prenatal History/Complications:  - cHTN - GBS positive - Alpha Thalassemia Silent Carrier  Past Medical History: Past Medical History:  Diagnosis Date   Hypertension    Medical history non-contributory    Past Surgical History: Past Surgical History:  Procedure Laterality Date   NO PAST SURGERIES     Obstetrical History: OB History     Gravida  1   Para      Term      Preterm      AB      Living         SAB      IAB      Ectopic      Multiple      Live Births             Social History Social History   Socioeconomic History   Marital status: Single    Spouse name: Not on file   Number of children: Not on file   Years of education: Not on file   Highest education level: Not on file  Occupational History   Not on file  Tobacco Use   Smoking status: Never   Smokeless tobacco: Never  Vaping Use   Vaping Use: Never used  Substance and Sexual Activity   Alcohol use: Not Currently    Comment: weekly   Drug use: Not Currently    Types: Marijuana   Sexual activity: Not Currently    Birth control/protection: None  Other Topics Concern   Not on file  Social History Narrative   Not on file   Social Determinants of Health   Financial Resource Strain:  Not on file  Food Insecurity: No Food Insecurity   Worried About Running Out of Food in the Last Year: Never true   Ran Out of Food in the Last Year: Never true  Transportation Needs: No Transportation Needs   Lack of Transportation (Medical): No   Lack of Transportation (Non-Medical): No  Physical Activity: Not on file  Stress: Not on file  Social Connections: Not on file   Family History: Family History  Problem Relation Age of Onset   Diabetes Mother    Hypertension Mother    Hypertension Father    Allergies: No Known Allergies  Medications Prior to Admission  Medication Sig Dispense Refill Last Dose   Prenatal Vit-Fe Fumarate-FA (MULTIVITAMIN-PRENATAL) 27-0.8 MG TABS tablet Take 1 tablet by mouth daily at 12 noon.   01/14/2021   aspirin EC 81 MG tablet Take 1 tablet (81 mg total) by mouth daily. Take after 12 weeks for prevention of preeclampsia later in pregnancy 300 tablet 2    Blood Pressure Monitoring (BLOOD PRESSURE KIT) DEVI 1 Device by Does not apply route as needed. 1 each 0    Review of Systems   All systems reviewed and negative except  as stated in HPI  Blood pressure (!) 148/74, pulse 92, temperature 97.9 F (36.6 C), temperature source Oral, resp. rate 14, last menstrual period 03/08/2020, SpO2 98 %. General appearance: alert, no distress, and obese Lungs: effort normal Heart: regular rate  Abdomen: soft, non-tender; gravid, EFW approximately 7-8lbs via Leopold's Extremities: no sign of DVT Pelvic: small ingrown hair on right labia majora, no HSV lesions Presentation: cephalic via BSUS Fetal monitoring: 130 bpm, moderate variability, +15x15 accels, no decels Uterine activity: quiet  Dilation: Closed Effacement (%): Thick Station: -3 Exam by:: [place] Constable, RN  Prenatal labs: ABO, Rh: --/--/AB POS (12/16 1140) Antibody: NEG (12/16 1140) Rubella: 12.10 (06/07 1529) RPR: Non Reactive (10/18 0915)  HBsAg: Negative (06/07 1529)  HIV: Non Reactive (10/18  0915)  GBS: Positive/-- (12/01 1658)  2 hr Glucola: 89/135/102 Genetic screening: Low risk NIPS, AFP negative Anatomy US: normal  Prenatal Transfer Tool  Maternal Diabetes: No Genetic Screening: Normal Maternal Ultrasounds/Referrals: Normal Fetal Ultrasounds or other Referrals:  None Maternal Substance Abuse:  No Significant Maternal Medications:  None Significant Maternal Lab Results: Group B Strep positive  Results for orders placed or performed during the hospital encounter of 01/14/21 (from the past 24 hour(s))  Type and screen   Collection Time: 01/14/21 11:40 AM  Result Value Ref Range   ABO/RH(D) AB POS    Antibody Screen NEG    Sample Expiration      01/17/2021,2359 Performed at [facility], [address], [place], [place] [address]   Protein / creatinine ratio, urine   Collection Time: 01/14/21 11:48 AM  Result Value Ref Range   Creatinine, Urine 57.04 mg/dL   Total Protein, Urine 11 mg/dL   Protein Creatinine Ratio 0.19 (H) 0.00 - 0.15 mg/mg[Cre]    Patient Active Problem List   Diagnosis Date Noted   Chronic hypertension during pregnancy, antepartum 01/14/2021   Group B Streptococcus carrier, +RV culture, currently pregnant 01/02/2021   Alpha thalassemia silent carrier 08/19/2020   Supervision of high risk pregnancy, antepartum 07/06/2020   Chronic hypertension affecting pregnancy 07/06/2020   Assessment/Plan:  Veronica Townsend is a 25 y.o. G1P0 at 72w0dhere for IOL d/t cHTN  #Labor: Cytotec. Plan for FB placement at later time. #cHTN: Mild range BP. Asymptomatic. Labs pending. #Pain: Epidural prn #FWB: Category 1 #ID: GBS Positive > PCN #MOF: Breast #MOC: IP Nexplanon #Circ: Plans IP    DRenee Harder CNM  01/14/2021, 1:10 PM

## 2021-01-14 NOTE — Progress Notes (Signed)
Veronica Townsend is a 25 y.o. G1P0 at [redacted]w[redacted]d admitted for IOL d/t cHTN  Subjective: Patient is doing well. No concerns at this time.  Objective: BP 113/60    Pulse 86    Temp 97.9 F (36.6 C) (Oral)    Resp 16    Ht 5\' 9"  (1.753 m)    Wt 120.2 kg    LMP 03/08/2020 Comment: was spotting in march for 3 days   SpO2 98%    BMI 39.13 kg/m  No intake/output data recorded. No intake/output data recorded.  FHT: 125 bpm, moderate variability, +15x15 accels, no decels UC: quiet SVE:   Dilation: Closed Effacement (%): Thick Station: -3 Exam by:: Aztlan Coll CNM  Labs: Lab Results  Component Value Date   WBC 10.8 (H) 01/14/2021   HGB 10.2 (L) 01/14/2021   HCT 32.6 (L) 01/14/2021   MCV 83.2 01/14/2021   PLT 227 01/14/2021    Assessment / Plan: 01/16/2021 Devincentis 25 y.o. G1P0 at [redacted]w[redacted]d here for IOL d/t cHTN  Labor: 2nd dose of cytotec given. Plan for FB placement when able cHTN:  BP's normotensive, labs wnl. Patient asymptomatic. Continue to monitor Fetal Wellbeing:  Category I Pain Control:  Epidural prn I/D:  GBS pos > PCN Anticipated MOD:  NSVD   [redacted]w[redacted]d, CNM 01/14/2021, 4:26 PM

## 2021-01-14 NOTE — Progress Notes (Signed)
Labor Progress Note Lillyahna Hemberger is a 25 y.o. G1P0 at [redacted]w[redacted]d presented for IOL due to [employer] per RN, patient doing well.   O:  BP 126/69    Pulse 78    Temp 97.7 F (36.5 C) (Oral)    Resp 16    Ht 5\' 9"  (1.753 m)    Wt 120.2 kg    LMP 03/08/2020 Comment: was spotting in march for 3 days   SpO2 98%    BMI 39.13 kg/m  EFM: 130/mod/15x15/none  CVE: Dilation: Fingertip Effacement (%): 30 Station: -3 Presentation: Vertex Exam by:: [email or web address] RN   A&P: 25 y.o. G1P0 [redacted]w[redacted]d  #Labor: Some progression since last check, gave additional cytotec (switch to buccal, currently s/p 2 vaginal). Hopeful to place FB on next check. #Pain: PRN #FWB: Cat 1 #GBS positive PCN  #Chronic Hypertension: BP currently WNL. Pre-e labs WNL. Cont to monitor.   [redacted]w[redacted]d, DO 11:08 PM

## 2021-01-15 ENCOUNTER — Inpatient Hospital Stay (HOSPITAL_COMMUNITY): Payer: Medicaid Other | Admitting: Anesthesiology

## 2021-01-15 LAB — CBC WITH DIFFERENTIAL/PLATELET
Abs Immature Granulocytes: 0.06 10*3/uL (ref 0.00–0.07)
Basophils Absolute: 0 10*3/uL (ref 0.0–0.1)
Basophils Relative: 0 %
Eosinophils Absolute: 0 10*3/uL (ref 0.0–0.5)
Eosinophils Relative: 0 %
HCT: 32.2 % — ABNORMAL LOW (ref 36.0–46.0)
Hemoglobin: 10.6 g/dL — ABNORMAL LOW (ref 12.0–15.0)
Immature Granulocytes: 1 %
Lymphocytes Relative: 19 %
Lymphs Abs: 1.9 10*3/uL (ref 0.7–4.0)
MCH: 26.9 pg (ref 26.0–34.0)
MCHC: 32.9 g/dL (ref 30.0–36.0)
MCV: 81.7 fL (ref 80.0–100.0)
Monocytes Absolute: 0.7 10*3/uL (ref 0.1–1.0)
Monocytes Relative: 7 %
Neutro Abs: 7.4 10*3/uL (ref 1.7–7.7)
Neutrophils Relative %: 73 %
Platelets: 224 10*3/uL (ref 150–400)
RBC: 3.94 MIL/uL (ref 3.87–5.11)
RDW: 13.3 % (ref 11.5–15.5)
WBC: 10.2 10*3/uL (ref 4.0–10.5)
nRBC: 0 % (ref 0.0–0.2)

## 2021-01-15 LAB — RPR: RPR Ser Ql: NONREACTIVE

## 2021-01-15 MED ORDER — FENTANYL-BUPIVACAINE-NACL 0.5-0.125-0.9 MG/250ML-% EP SOLN: 12.0000 mL/h | EPIDURAL | Status: DC | PRN

## 2021-01-15 MED ORDER — OXYTOCIN-SODIUM CHLORIDE 30-0.9 UT/500ML-% IV SOLN
1.0000 m[IU]/min | INTRAVENOUS | Status: DC
  Administered 2021-01-15: 4 m[IU]/min via INTRAVENOUS
  Administered 2021-01-15: 2 m[IU]/min via INTRAVENOUS
  Filled 2021-01-15: qty 500

## 2021-01-15 MED ORDER — PHENYLEPHRINE 40 MCG/ML (10ML) SYRINGE FOR IV PUSH (FOR BLOOD PRESSURE SUPPORT)
PREFILLED_SYRINGE | INTRAVENOUS | Status: AC
  Administered 2021-01-15: 80 ug via INTRAVENOUS
  Filled 2021-01-15: qty 10

## 2021-01-15 MED ORDER — LACTATED RINGERS IV SOLN: 500.0000 mL | Freq: Once | INTRAVENOUS | Status: DC

## 2021-01-15 MED ORDER — FENTANYL CITRATE (PF) 100 MCG/2ML IJ SOLN
100.0000 ug | INTRAMUSCULAR | Status: DC | PRN
  Administered 2021-01-15: 100 ug via INTRAVENOUS
  Filled 2021-01-15: qty 2

## 2021-01-15 MED ORDER — DIPHENHYDRAMINE HCL 50 MG/ML IJ SOLN: 12.5000 mg | INTRAMUSCULAR | Status: DC | PRN

## 2021-01-15 MED ORDER — EPHEDRINE 5 MG/ML INJ: 10.0000 mg | INTRAVENOUS | Status: DC | PRN

## 2021-01-15 MED ORDER — LIDOCAINE HCL (PF) 1 % IJ SOLN
INTRAMUSCULAR | Status: DC | PRN
  Administered 2021-01-15 (×2): 4 mL via EPIDURAL

## 2021-01-15 MED ORDER — PHENYLEPHRINE 40 MCG/ML (10ML) SYRINGE FOR IV PUSH (FOR BLOOD PRESSURE SUPPORT)
80.0000 ug | PREFILLED_SYRINGE | INTRAVENOUS | Status: AC | PRN
  Administered 2021-01-15 (×2): 80 ug via INTRAVENOUS

## 2021-01-15 MED ORDER — TERBUTALINE SULFATE 1 MG/ML IJ SOLN: 0.2500 mg | Freq: Once | INTRAMUSCULAR | Status: DC | PRN

## 2021-01-15 MED ORDER — FENTANYL-BUPIVACAINE-NACL 0.5-0.125-0.9 MG/250ML-% EP SOLN
EPIDURAL | Status: AC
  Filled 2021-01-15: qty 250

## 2021-01-15 MED ORDER — PHENYLEPHRINE 40 MCG/ML (10ML) SYRINGE FOR IV PUSH (FOR BLOOD PRESSURE SUPPORT): 80.0000 ug | PREFILLED_SYRINGE | INTRAVENOUS | Status: DC | PRN

## 2021-01-15 MED ORDER — FENTANYL-BUPIVACAINE-NACL 0.5-0.125-0.9 MG/250ML-% EP SOLN
EPIDURAL | Status: DC | PRN
  Administered 2021-01-15: 12 mL/h via EPIDURAL

## 2021-01-15 NOTE — Progress Notes (Signed)
Labor Progress Note Jamara Vary is a 25 y.o. G1P0 at [redacted]w[redacted]d presented for IOl for CHTN  S:  Feeling irregular ctx and constant cramping. Feeling hungry.   O:  BP 124/80    Pulse 80    Temp 97.8 F (36.6 C) (Oral)    Resp 18    Ht 5\' 9"  (1.753 m)    Wt 120.2 kg    LMP 03/08/2020 Comment: was spotting in march for 3 days   SpO2 98%    BMI 39.13 kg/m  EFM: baseline 130 bpm/ mod variability/ + accels/ no decels  [place]oco/IUPC: irregular/UI SVE: Dilation: 4 Effacement (%): 70 Station: -2 Presentation: Vertex Exam by:: Rishab Stoudt, CNM  A/P: 25 y.o. G1P0 [redacted]w[redacted]d  1. Labor: latent 2. FWB: Cat I 3. Pain: analgesia/anesthesia/NO prn 4. CHTN: stable  Will allow a meal then start Pitocin. Anticipate labor progress and SVD.  [redacted]w[redacted]d, CNM 8:51 AM

## 2021-01-15 NOTE — Progress Notes (Signed)
Labor Progress Note  Patient now 5/70/-2. After verbal consent, proceeded with AROM around 2130, retrieved small amount of clear fluid. Head well applied following procedure. FHT Cat 1.   Called by RN approximately 30 minutes later for late decelerations and 3 minute deceleration into the HR 80's. She was checked again now 6.5/70/-2. IV fluid bolus started and pit discontinued. Placed on her left lateral side with return to baseline. BP found to be low 89/34 associated with maternal lightheadedness, treated with phenylephrine x2 with improvement and resolution of symptoms.   FHT now baseline 140/min to mod/10x10/none.   Will continue to monitor closely, plan restart pit in the next 30 minutes if FHT remains reassuring.  Allayne Stack, DO

## 2021-01-15 NOTE — Anesthesia Procedure Notes (Signed)
Epidural Patient location during procedure: OB Start time: 01/15/2021 6:34 PM End time: 01/15/2021 6:37 PM  Staffing Anesthesiologist: Kaylyn Layer, MD Performed: anesthesiologist   Preanesthetic Checklist Completed: patient identified, IV checked, risks and benefits discussed, monitors and equipment checked, pre-op evaluation and timeout performed  Epidural Patient position: sitting Prep: DuraPrep and site prepped and draped Patient monitoring: continuous pulse ox, blood pressure and heart rate Approach: midline Location: L3-L4 Injection technique: LOR air  Needle:  Needle type: Tuohy  Needle gauge: 17 G Needle length: 9 cm Needle insertion depth: 7 cm Catheter type: closed end flexible Catheter size: 19 Gauge Catheter at skin depth: 12 cm Test dose: negative and Other (1% lidocaine)  Assessment Events: blood not aspirated, injection not painful, no injection resistance, no paresthesia and negative IV test  Additional Notes Patient identified. Risks, benefits, and alternatives discussed with patient including but not limited to bleeding, infection, nerve damage, paralysis, failed block, incomplete pain control, headache, blood pressure changes, nausea, vomiting, reactions to medication, itching, and postpartum back pain. Confirmed with bedside nurse the patient's most recent platelet count. Confirmed with patient that they are not currently taking any anticoagulation, have any bleeding history, or any family history of bleeding disorders. Patient expressed understanding and wished to proceed. All questions were answered. Sterile technique was used throughout the entire procedure. Please see nursing notes for vital signs.   Crisp LOR on first pass. Test dose was given through epidural catheter and negative prior to continuing to dose epidural or start infusion. Warning signs of high block given to the patient including shortness of breath, tingling/numbness in hands, complete  motor block, or any concerning symptoms with instructions to call for help. Patient was given instructions on fall risk and not to get out of bed. All questions and concerns addressed with instructions to call with any issues or inadequate analgesia.  Reason for block:procedure for pain

## 2021-01-15 NOTE — Progress Notes (Signed)
Labor Progress Note Veronica Townsend is a 25 y.o. G1P0 at [redacted]w[redacted]d presented for IOL due to [place].   S: Doing well, feeling some cramping.   O:  BP 137/73    Pulse 80    Temp 97.7 F (36.5 C) (Oral)    Resp 16    Ht 5\' 9"  (1.753 m)    Wt 120.2 kg    LMP 03/08/2020 Comment: was spotting in march for 3 days   SpO2 98%    BMI 39.13 kg/m  EFM: 130/mod/15x15/none  CVE: Dilation: Fingertip Effacement (%): 30 Station: -3 Presentation: Vertex Exam by:: Dr. [email or web address]   A&P: 25 y.o. G1P0 [redacted]w[redacted]d  #Labor: Minimal progression since last check and quite posterior. Plan for additional cytotec buccal, 4th dose. Hopeful for FB placement on next check.  #Pain: PRN  #FWB: Cat 1  #GBS positive PCN  #Chronic Hypertension: Bp currently WNL. No symptoms. Pre-e labs WNL. Cont to monitor.   [redacted]w[redacted]d, DO 2:13 AM

## 2021-01-15 NOTE — Progress Notes (Signed)
Labor Progress Note Veronica Townsend is a 25 y.o. G1P0 at [redacted]w[redacted]d presented for IOL for CHTN  S:  Feeling some ctx, had IV pain meds earlier.   O:  BP (!) 121/52    Pulse 82    Temp 97.8 F (36.6 C) (Oral)    Resp 18    Ht 5\' 9"  (1.753 m)    Wt 120.2 kg    LMP 03/08/2020 Comment: was spotting in march for 3 days   SpO2 99%    BMI 39.13 kg/m  EFM: baseline 130 bpm/ mod variability/ + accels/ no decels  Toco/IUPC: irregular SVE: deferred Pitocin: 14 mu/min  A/P: 25 y.o. G1P0 [redacted]w[redacted]d  1. Labor: latent 2. FWB: Cat I 3. Pain: analgesia/anesthesia/NO prn 4. CHTN: stable  Recommend AROM. Pt requesting epidural first. Will order epidural.   [redacted]w[redacted]d, CNM 7:10 PM

## 2021-01-15 NOTE — Progress Notes (Signed)
Labor Progress Note Jacqlyn Marolf is a 25 y.o. G1P0 at [redacted]w[redacted]d presented for IOL for CHTN  S:  Feeling some ctx, "they're just subtle"  O:  BP 139/81    Pulse 87    Temp 97.9 F (36.6 C) (Oral)    Resp 17    Ht 5\' 9"  (1.753 m)    Wt 120.2 kg    LMP 03/08/2020 Comment: was spotting in march for 3 days   SpO2 98%    BMI 39.13 kg/m  EFM: baseline 130 bpm/ mod variability/ + accels/ no decels  Toco/IUPC: 2-4 SVE: deferred Pitocin: 6 mu/min  A/P: 25 y.o. G1P0 [redacted]w[redacted]d  1. Labor: latent 2. FWB: Cat I 3. Pain: analgesia/anesthesia/NO prn 4. CHTN: stable  Continue Pitocin titration. Consider AROM.  Anticipate labor progress and SVD.  [redacted]w[redacted]d, CNM 1:26 PM

## 2021-01-15 NOTE — Anesthesia Preprocedure Evaluation (Signed)
Anesthesia Evaluation  °Patient identified by MRN, date of birth, ID band °Patient awake ° ° ° °Reviewed: °Allergy & Precautions, Patient's Chart, lab work & pertinent test results ° °History of Anesthesia Complications °Negative for: history of anesthetic complications ° °Airway °Mallampati: II ° °TM Distance: >3 FB °Neck ROM: Full ° ° ° Dental °no notable dental hx. ° °  °Pulmonary °neg pulmonary ROS,  °  °Pulmonary exam normal ° ° ° ° ° ° ° Cardiovascular °hypertension, Normal cardiovascular exam ° ° °  °Neuro/Psych °negative neurological ROS ° negative psych ROS  ° GI/Hepatic °negative GI ROS, Neg liver ROS,   °Endo/Other  °negative endocrine ROS ° Renal/GU °negative Renal ROS  °negative genitourinary °  °Musculoskeletal °negative musculoskeletal ROS °(+)  ° Abdominal °  °Peds ° Hematology °negative hematology ROS °(+)   °Anesthesia Other Findings °Day of surgery medications reviewed with patient. ° Reproductive/Obstetrics °(+) Pregnancy ° °  ° ° ° ° ° ° ° ° ° ° ° ° ° °  °  ° ° ° ° ° ° ° ° °Anesthesia Physical °Anesthesia Plan ° °ASA: 2 ° °Anesthesia Plan: Epidural  ° °Post-op Pain Management:   ° °Induction:  ° °PONV Risk Score and Plan: Treatment may vary due to age or medical condition ° °Airway Management Planned: Natural Airway ° °Additional Equipment: Fetal Monitoring ° °Intra-op Plan:  ° °Post-operative Plan:  ° °Informed Consent: I have reviewed the patients History and Physical, chart, labs and discussed the procedure including the risks, benefits and alternatives for the proposed anesthesia with the patient or authorized representative who has indicated his/her understanding and acceptance.  ° ° ° ° ° °Plan Discussed with:  ° °Anesthesia Plan Comments:   ° ° ° ° ° ° °Anesthesia Quick Evaluation ° °

## 2021-01-16 DIAGNOSIS — O1002 Pre-existing essential hypertension complicating childbirth: Secondary | ICD-10-CM

## 2021-01-16 DIAGNOSIS — O99820 Streptococcus B carrier state complicating pregnancy: Secondary | ICD-10-CM

## 2021-01-16 DIAGNOSIS — Z3A39 39 weeks gestation of pregnancy: Secondary | ICD-10-CM

## 2021-01-16 MED ORDER — TRANEXAMIC ACID-NACL 1000-0.7 MG/100ML-% IV SOLN: 1000.0000 mg | INTRAVENOUS | Status: DC

## 2021-01-16 MED ORDER — TETANUS-DIPHTH-ACELL PERTUSSIS 5-2.5-18.5 LF-MCG/0.5 IM SUSY: 0.5000 mL | PREFILLED_SYRINGE | Freq: Once | INTRAMUSCULAR | Status: DC

## 2021-01-16 MED ORDER — MEASLES, MUMPS & RUBELLA VAC IJ SOLR: 0.5000 mL | Freq: Once | INTRAMUSCULAR | Status: DC

## 2021-01-16 MED ORDER — DIBUCAINE (PERIANAL) 1 % EX OINT: 1.0000 "application " | TOPICAL_OINTMENT | CUTANEOUS | Status: DC | PRN

## 2021-01-16 MED ORDER — SIMETHICONE 80 MG PO CHEW: 80.0000 mg | CHEWABLE_TABLET | ORAL | Status: DC | PRN

## 2021-01-16 MED ORDER — TRANEXAMIC ACID-NACL 1000-0.7 MG/100ML-% IV SOLN
INTRAVENOUS | Status: AC
  Administered 2021-01-16: 10:00:00 1000 mg
  Filled 2021-01-16: qty 100

## 2021-01-16 MED ORDER — ONDANSETRON HCL 4 MG/2ML IJ SOLN: 4.0000 mg | INTRAMUSCULAR | Status: DC | PRN

## 2021-01-16 MED ORDER — MISOPROSTOL 200 MCG PO TABS
ORAL_TABLET | ORAL | Status: AC
  Administered 2021-01-16: 10:00:00 1000 ug
  Filled 2021-01-16: qty 5

## 2021-01-16 MED ORDER — MISOPROSTOL 200 MCG PO TABS: 1000.0000 ug | ORAL_TABLET | Freq: Once | ORAL | Status: DC

## 2021-01-16 MED ORDER — OXYTOCIN-SODIUM CHLORIDE 30-0.9 UT/500ML-% IV SOLN
INTRAVENOUS | Status: AC
  Filled 2021-01-16: qty 500

## 2021-01-16 MED ORDER — BENZOCAINE-MENTHOL 20-0.5 % EX AERO
1.0000 "application " | INHALATION_SPRAY | CUTANEOUS | Status: DC | PRN
  Administered 2021-01-16: 1 via TOPICAL
  Filled 2021-01-16: qty 56

## 2021-01-16 MED ORDER — OXYTOCIN BOLUS FROM INFUSION: 333.0000 mL | Freq: Once | INTRAVENOUS | Status: DC

## 2021-01-16 MED ORDER — ONDANSETRON HCL 4 MG PO TABS: 4.0000 mg | ORAL_TABLET | ORAL | Status: DC | PRN

## 2021-01-16 MED ORDER — METHYLERGONOVINE MALEATE 0.2 MG/ML IJ SOLN: 0.2000 mg | Freq: Once | INTRAMUSCULAR | Status: DC

## 2021-01-16 MED ORDER — PRENATAL MULTIVITAMIN CH
1.0000 | ORAL_TABLET | Freq: Every day | ORAL | Status: DC
  Administered 2021-01-17: 11:00:00 1 via ORAL
  Filled 2021-01-16: qty 1

## 2021-01-16 MED ORDER — SENNOSIDES-DOCUSATE SODIUM 8.6-50 MG PO TABS
2.0000 | ORAL_TABLET | Freq: Every day | ORAL | Status: DC
  Administered 2021-01-17: 10:00:00 2 via ORAL
  Filled 2021-01-16: qty 2

## 2021-01-16 MED ORDER — DIPHENHYDRAMINE HCL 25 MG PO CAPS: 25.0000 mg | ORAL_CAPSULE | Freq: Four times a day (QID) | ORAL | Status: DC | PRN

## 2021-01-16 MED ORDER — WITCH HAZEL-GLYCERIN EX PADS: 1.0000 "application " | MEDICATED_PAD | CUTANEOUS | Status: DC | PRN

## 2021-01-16 MED ORDER — MEDROXYPROGESTERONE ACETATE 150 MG/ML IM SUSP: 150.0000 mg | INTRAMUSCULAR | Status: DC | PRN

## 2021-01-16 MED ORDER — NIFEDIPINE ER OSMOTIC RELEASE 30 MG PO TB24
30.0000 mg | ORAL_TABLET | Freq: Every day | ORAL | Status: DC
  Administered 2021-01-16 – 2021-01-17 (×2): 30 mg via ORAL
  Filled 2021-01-16 (×2): qty 1

## 2021-01-16 MED ORDER — METHYLERGONOVINE MALEATE 0.2 MG/ML IJ SOLN
INTRAMUSCULAR | Status: AC
  Administered 2021-01-16: 10:00:00 0.2 mg
  Filled 2021-01-16: qty 1

## 2021-01-16 MED ORDER — IBUPROFEN 600 MG PO TABS
600.0000 mg | ORAL_TABLET | Freq: Four times a day (QID) | ORAL | Status: DC
  Administered 2021-01-16 – 2021-01-17 (×5): 600 mg via ORAL
  Filled 2021-01-16 (×5): qty 1

## 2021-01-16 MED ORDER — COCONUT OIL OIL: 1.0000 "application " | TOPICAL_OIL | Status: DC | PRN

## 2021-01-16 MED ORDER — ACETAMINOPHEN 325 MG PO TABS: 650.0000 mg | ORAL_TABLET | ORAL | Status: DC | PRN

## 2021-01-16 NOTE — Anesthesia Postprocedure Evaluation (Signed)
Anesthesia Post Note  Patient: Veronica Townsend  Procedure(s) Performed: AN AD HOC LABOR EPIDURAL     Patient location during evaluation: Mother Baby Anesthesia Type: Epidural Level of consciousness: awake and alert Pain management: pain level controlled Vital Signs Assessment: post-procedure vital signs reviewed and stable Respiratory status: spontaneous breathing, nonlabored ventilation and respiratory function stable Cardiovascular status: stable Postop Assessment: no headache, no backache and epidural receding Anesthetic complications: no   No notable events documented.  Last Vitals:  Vitals:   01/16/21 1015 01/16/21 1030  BP: 124/66 118/75  Pulse: 93 (!) 103  Resp: 18 18  Temp:  37.4 C  SpO2:      Last Pain:  Vitals:   01/16/21 1030  TempSrc: Oral  PainSc:    Pain Goal:                Epidural/Spinal Function Cutaneous sensation: Pins and Needles (01/16/21 1115), Patient able to flex knees: Yes (01/16/21 1115), Patient able to lift hips off bed: Yes (01/16/21 1115), Back pain beyond tenderness at insertion site: No (01/16/21 1115), Progressively worsening motor and/or sensory loss: No (01/16/21 1115), Bowel and/or bladder incontinence post epidural: No (01/16/21 1115)  Duron Meister

## 2021-01-16 NOTE — Lactation Note (Signed)
This note was copied from a baby's chart. Lactation Consultation Note  Patient Name: Boy Stefana Lodico OFVWA'Q Date: 01/16/2021 Reason for consult: L&D Initial assessment Age:24 hours  P1, Baby cueing.  Assisted with latching no R breast. During feeding, mother states vomiting and baby came off breast.  Lactation to follow up on MBU.    Feeding Mother's Current Feeding Choice: Breast Milk  LATCH Score Latch: Grasps breast easily, tongue down, lips flanged, rhythmical sucking.  Audible Swallowing: A few with stimulation  Type of Nipple: Everted at rest and after stimulation  Comfort (Breast/Nipple): Soft / non-tender  Hold (Positioning): Assistance needed to correctly position infant at breast and maintain latch.  LATCH Score: 8   Interventions Interventions: Assisted with latch;Skin to skin;Education  Consult Status Consult Status: Follow-up from L&D    Dahlia Byes [facility] 01/16/2021, 10:47 AM

## 2021-01-16 NOTE — Lactation Note (Signed)
This note was copied from a baby's chart. Lactation Consultation Note Mom tieing gown when LC entered rm. Mom stated she had just finished BF. Mom stated it hurt a little bit. Asked mom if she knew how to hand express. Mom demonstrated breast massage. W/permission LC demonstrated on mom how to hand express. Mom demonstrated back. Colostrum easily expressed. Mom excited. Baby cueing (probably from smelling colostrum). LC suggested putting baby back to breast and LC would assist in latching to see why it was hurting. Mom placed baby in cradle position. No support used to elevate baby to breast other than mom holding baby. LC placed 2 pillows in mom's lap. It still wasn't a good latch. LC placed pillows in football hold and baby latched. LC flanged lips to widen flange. Mom stated that felt much better.  Newborn feeding habits, STS, I&O, supply and demand discussed. Lactation brochure given.  Patient Name: Veronica Townsend URKYH'C Date: 01/16/2021 Reason for consult: Initial assessment;Term;Primapara Age:28 hours  Maternal Data Has patient been taught Hand Expression?: Yes Does the patient have breastfeeding experience prior to this delivery?: No  Feeding    LATCH Score Latch: Grasps breast easily, tongue down, lips flanged, rhythmical sucking.  Audible Swallowing: A few with stimulation  Type of Nipple: Everted at rest and after stimulation  Comfort (Breast/Nipple): Soft / non-tender  Hold (Positioning): Assistance needed to correctly position infant at breast and maintain latch.  LATCH Score: 8   Lactation Tools Discussed/Used    Interventions    Discharge    Consult Status Consult Status: Follow-up Date: 01/17/21 Follow-up type: In-patient    Charyl Dancer 01/16/2021, 8:24 PM

## 2021-01-16 NOTE — Progress Notes (Signed)
Labor Progress Note Veronica Townsend is a 26 y.o. G1P0 at [redacted]w[redacted]d presented for IOL due to chronic hypertension.   S: Doing well, feeling some pressure with contractions.   O:  BP (!) 104/53    Pulse 66    Temp (!) 97.5 F (36.4 C) (Oral)    Resp 18    Ht 5\' 9"  (1.753 m)    Wt 120.2 kg    LMP 03/08/2020 Comment: was spotting in march for 3 days   SpO2 99%    BMI 39.13 kg/m  EFM: 135/mod/15x15/occasional variable  CVE: Dilation: 5.5 Effacement (%): 70 Station: -2 Presentation: Vertex Exam by:: Dr. [email or web address]   A&P: 25 y.o. G1P0 [redacted]w[redacted]d  #Labor: Minimal to no progression since last check, however has been currently working on up titrating pit. With concurrent difficulty to monitor contractions, placed IUPC. During evaluation afterwards, patient reported feeling like she was going to pass out (felt this earlier this evening with low BP). Took her off her back and placed onto her left lateral side and given IV bolus. During this time, had a two minute fetal deceleration that resolved with lateral placement at well.   Suspect symptomatic with IVC compression. Her symptoms resolved with above regimen. BP was 104/53, but has been variable throughout the night. Will treat if needed.   #Pain: Epidural  #FWB: Now Cat II with minimal variability, will allow fetal recovery   #GBS positive PCN  [redacted]w[redacted]d, DO 2:25 AM

## 2021-01-16 NOTE — Discharge Summary (Signed)
Postpartum Discharge Summary     Patient Name: Veronica Townsend DOB: 1995-03-23 MRN: [id number]  Date of admission: 01/14/2021 Delivery date:01/16/2021  Delivering provider: Renard Matter  Date of discharge: 01/17/2021  Admitting diagnosis: Chronic hypertension during pregnancy, antepartum [O10.919] Intrauterine pregnancy: [redacted]w[redacted]d    Secondary diagnosis:  Principal Problem:   Chronic hypertension during pregnancy, antepartum Active Problems:   Supervision of high risk pregnancy, antepartum   Alpha thalassemia silent carrier   Group B Streptococcus carrier, +RV culture, currently pregnant   Vaginal delivery  Additional problems: None   Discharge diagnosis: Term Pregnancy Delivered and CHTN                                              Post partum procedures: Nexplanon to be placed prior to discharge Augmentation: AROM, Pitocin, and Cytotec Complications: None  Hospital course: Induction of Labor With Vaginal Delivery   25y.o. yo G1P0 at 375w2das admitted to the hospital 01/14/2021 for induction of labor.  Indication for induction:  cHTN .  Patient had an uncomplicated labor course as follows: Membrane Rupture Time/Date: 9:32 PM ,01/15/2021   Delivery Method:Vaginal, Spontaneous  Episiotomy: None  Lacerations:  1st degree;Perineal  Details of delivery can be found in separate delivery note.  Patient had a routine postpartum course. She was started on Procardia 30 mg for elevated blood pressures postpartum with good result. She will continue a short course of Lasix 20 mg daily for 5 days. She will have a BP check in one week. Signs/symptoms of pre-eclampsia reviewed and advised patient to call office with BP >140/90. All questions and concerns addressed. Patient is discharged home 01/17/21.  Newborn Data: Birth date:01/16/2021  Birth time:9:49 AM  Gender:Female  Living status:Living  Apgars:7 ,9  Weight:3220 g   Magnesium Sulfate received: No BMZ received: No Rhophylac:  N/A MMR: N/A T-DaP: Declined Flu: Declined Transfusion: No  Physical exam  Vitals:   01/16/21 2053 01/17/21 0050 01/17/21 0609 01/17/21 0957  BP: (!) 124/54 (!) 120/56 (!) 115/55 135/76  Pulse: 79 77 70 84  Resp:  18 18   Temp:  97.9 F (36.6 C) 97.7 F (36.5 C)   TempSrc:  Oral Oral   SpO2:      Weight:      Height:       General: alert, cooperative, and no distress Lochia: appropriate Uterine Fundus: firm and below umbilicus  DVT Evaluation: trace LE edema to ankles bilaterally, no calf tenderness to palpation  Labs: Lab Results  Component Value Date   WBC 10.2 01/15/2021   HGB 10.6 (L) 01/15/2021   HCT 32.2 (L) 01/15/2021   MCV 81.7 01/15/2021   PLT 224 01/15/2021   CMP Latest Ref Rng & Units 01/14/2021  Glucose 70 - 99 mg/dL 88  BUN 6 - 20 mg/dL 10  Creatinine 0.44 - 1.00 mg/dL 0.44  Sodium 135 - 145 mmol/L 132(L)  Potassium 3.5 - 5.1 mmol/L 3.9  Chloride 98 - 111 mmol/L 107  CO2 22 - 32 mmol/L 20(L)  Calcium 8.9 - 10.3 mg/dL 9.1  Total Protein 6.5 - 8.1 g/dL 6.6  Total Bilirubin 0.3 - 1.2 mg/dL 0.2(L)  Alkaline Phos 38 - 126 U/L 162(H)  AST 15 - 41 U/L 14(L)  ALT 0 - 44 U/L 13   Edinburgh Score: Edinburgh Postnatal Depression Scale Screening Tool 01/17/2021  I have been able to laugh and see the funny side of things. 0  I have looked forward with enjoyment to things. 0  I have blamed myself unnecessarily when things went wrong. 1  I have been anxious or worried for no good reason. 1  I have felt scared or panicky for no good reason. 0  Things have been getting on top of me. 1  I have been so unhappy that I have had difficulty sleeping. 0  I have felt sad or miserable. 0  I have been so unhappy that I have been crying. 0  The thought of harming myself has occurred to me. 0  Edinburgh Postnatal Depression Scale Total 3     After visit meds:  Allergies as of 01/17/2021   No Known Allergies      Medication List     STOP taking these  medications    aspirin EC 81 MG tablet       TAKE these medications    acetaminophen 500 MG tablet Commonly known as: TYLENOL Take 2 tablets (1,000 mg total) by mouth every 8 (eight) hours as needed (pain).   Blood Pressure Kit Devi 1 Device by Does not apply route as needed.   furosemide 20 MG tablet Commonly known as: Lasix Take 1 tablet (20 mg total) by mouth daily for 5 days.   ibuprofen 600 MG tablet Commonly known as: ADVIL Take 1 tablet (600 mg total) by mouth every 6 (six) hours as needed (pain).   multivitamin-prenatal 27-0.8 MG Tabs tablet Take 1 tablet by mouth daily at 12 noon.   NIFEdipine 30 MG 24 hr tablet Commonly known as: ADALAT CC Take 1 tablet (30 mg total) by mouth daily. Start taking on: January 18, 2021         Discharge home in stable condition Infant Feeding: Breast Infant Disposition: home with mother Discharge instruction: per After Visit Summary and Postpartum booklet. Activity: Advance as tolerated. Pelvic rest for 6 weeks.  Diet: routine diet  Follow up Visit: Message sent to [facility] by Dr. Cy Blamer on 12/18  Please schedule this patient for a In person postpartum visit in 6 weeks with the following provider: Any provider. Additional Postpartum F/U: BP check 1 week  High risk pregnancy complicated by:  cHTN Delivery mode:  Vaginal, Spontaneous  Anticipated Birth Control:   Nexplanon  01/17/2021 Genia Del, MD

## 2021-01-16 NOTE — Progress Notes (Signed)
Call placed to MD to update B/P on admission 154/103,152/92 and now 1 hour check 143/72. Order to start Procardia 30 XL received. Patient denies H/A,blurry vision or epigastric pain

## 2021-01-17 ENCOUNTER — Other Ambulatory Visit (HOSPITAL_COMMUNITY): Payer: Self-pay

## 2021-01-17 DIAGNOSIS — Z30017 Encounter for initial prescription of implantable subdermal contraceptive: Secondary | ICD-10-CM

## 2021-01-17 MED ORDER — ACETAMINOPHEN 500 MG PO TABS
1000.0000 mg | ORAL_TABLET | Freq: Three times a day (TID) | ORAL | 0 refills | Status: AC | PRN
  Filled 2021-01-17: qty 60, 10d supply, fill #0

## 2021-01-17 MED ORDER — IBUPROFEN 600 MG PO TABS
600.0000 mg | ORAL_TABLET | Freq: Four times a day (QID) | ORAL | 0 refills | Status: DC | PRN
  Filled 2021-01-17: qty 40, 10d supply, fill #0

## 2021-01-17 MED ORDER — LIDOCAINE HCL 1 % IJ SOLN
0.0000 mL | Freq: Once | INTRAMUSCULAR | Status: DC | PRN
  Filled 2021-01-17: qty 20

## 2021-01-17 MED ORDER — ETONOGESTREL 68 MG ~~LOC~~ IMPL
68.0000 mg | DRUG_IMPLANT | Freq: Once | SUBCUTANEOUS | Status: AC
  Administered 2021-01-17: 11:00:00 68 mg via SUBCUTANEOUS
  Filled 2021-01-17: qty 1

## 2021-01-17 MED ORDER — FUROSEMIDE 20 MG PO TABS
20.0000 mg | ORAL_TABLET | Freq: Every day | ORAL | 0 refills | Status: DC
  Filled 2021-01-17: qty 5, 5d supply, fill #0

## 2021-01-17 MED ORDER — NIFEDIPINE ER 30 MG PO TB24
30.0000 mg | ORAL_TABLET | Freq: Every day | ORAL | 0 refills | Status: AC
  Filled 2021-01-17: qty 30, 30d supply, fill #0

## 2021-01-17 NOTE — Procedures (Signed)
Post-Placental Nexplanon Insertion Procedure Note  Patient was identified. Informed consent was signed, signed copy in chart. A time-out was performed.    The insertion site was identified 8-10 cm (3-4 inches) from the medial epicondyle of the humerus and 3-5 cm (1.25-2 inches) posterior to (below) the sulcus (groove) between the biceps and triceps muscles of the patient's right arm and marked. The site was prepped and draped in the usual sterile fashion. Pt was prepped with alcohol swab and then injected with 3 cc of 1% lidocaine. The site was prepped with betadine. Nexplanon removed form packaging,  Device confirmed in needle, then inserted full length of needle and withdrawn per handbook instructions. Provider and patient verified presence of the implant in the womans arm by palpation. Pt insertion site was covered with steristrips/adhesive bandage and pressure bandage. There was minimal blood loss. Patient tolerated procedure well.  Patient was given post procedure instructions and Nexplanon user card with expiration date. Condoms were recommended for STI prevention. Patient was asked to keep the pressure dressing on for 24 hours to minimize bruising and keep the adhesive bandage on for 3-5 days. The patient verbalized understanding of the plan of care and agrees.   Lot # [id number] Expiration Date 05/06/2023

## 2021-01-17 NOTE — Lactation Note (Signed)
This note was copied from a baby's chart. Lactation Consultation Note  Patient Name: Veronica Townsend [id number]'L Date: 01/17/2021 Reason for consult: Follow-up assessment;Mother's request;1st time breastfeeding;Primapara;Term;Breastfeeding assistance;RN request Age:24 hours  LC did some suck training to bring his tongue down in midst of the latch. LC then assisted Mom latching in football with signs of milk transfer. Infant still feeding at the end of the visit.   Plan 1. To feed based on cues 8-12x 24 hr period. Mom to offer breasts and look for signs of milk transfer.  2. Mom to use manual pump post pump q 3hrs for 10 min each breast. Mom to offer EBM via pace bottle and slow flow nipple 7-12 ml per feeding. Mom to increase as tolerated, BF supplementation guide provided.   All questions answered at the end of the visit.  Mom aware to reach out to [facility] outpatient services for lactation support.   Maternal Data Has patient been taught Hand Expression?: Yes  Feeding Mother's Current Feeding Choice: Breast Milk  LATCH Score Latch: Grasps breast easily, tongue down, lips flanged, rhythmical sucking.  Audible Swallowing: Spontaneous and intermittent  Type of Nipple: Everted at rest and after stimulation  Comfort (Breast/Nipple): Soft / non-tender  Hold (Positioning): Assistance needed to correctly position infant at breast and maintain latch.  LATCH Score: 9   Lactation Tools Discussed/Used Tools: Pump;Flanges Flange Size: 27 Breast pump type: Manual Pump Education: Setup, frequency, and cleaning;Milk Storage Reason for Pumping: increase stimulation Pumping frequency: every 3 hrs for 10 min each breast  Interventions Interventions: Breast feeding basics reviewed;Assisted with latch;Skin to skin;Breast massage;Hand express;Breast compression;Adjust position;Support pillows;Position options;Expressed milk;Hand pump;Education;Pace feeding;LC Psychologist, educational;Infant  Driven Feeding Algorithm education  Discharge Discharge Education: Engorgement and breast care;Warning signs for feeding baby Pump: Manual WIC Program: Yes  Consult Status Consult Status: Complete Date: 01/17/21    Shomari Scicchitano  Nicholson-Springer 01/17/2021, 4:32 PM

## 2021-01-18 ENCOUNTER — Telehealth: Payer: Self-pay

## 2021-01-18 NOTE — Telephone Encounter (Signed)
Transition Care Management Unsuccessful Follow-up Telephone Call  Date of discharge and from where:  01/17/2021 from [facility]  Attempts:  1st Attempt  Reason for unsuccessful TCM follow-up call:  Voice mail full

## 2021-01-19 NOTE — Telephone Encounter (Signed)
Transition Care Management Unsuccessful Follow-up Telephone Call  Date of discharge and from where:  01/17/2021 from [facility]  Attempts:  2nd Attempt  Reason for unsuccessful TCM follow-up call:  Voice mail full

## 2021-01-20 ENCOUNTER — Telehealth: Payer: Self-pay

## 2021-01-20 NOTE — Telephone Encounter (Signed)
Transition Care Management Follow-up Telephone Call Date of discharge and from where: 01/17/2021 from [facility] How have you been since you were released from the hospital? Pt stated that she is feeling pretty well. Pt took bp this morning and she stated that it was 168/82, patient was supine during this bp reading. I asked patient about activity prior to taking bp reading and she stated that she was active prior to reading. Pt stated that the Hospital wanted her to be concerned for any reading over 145/90. Pt denied cp, headache, sob, palpitation or dizziness. I did instruct patient on no activity, drinking  prior to bp reading. Also to make sure that she was in a sitting upright and relaxed position with her arm propped up on a table when taking her bp. Pt stated understanding of instructions. Pt was also informed to reach out to OBGYN with any systolic or diastolic reading higher than 145/90 per hospital instruction and to also contact OBGYN for any reading lower than 100/60. Pt also stated understanding of these instruction. Lastly informed patient that if any cardiac symptoms occur to seek immediate medical attention, pt stated understanding of that as well.  Routing this message as FYI to [employer]. Any questions or concerns? No  Items Reviewed: Did the pt receive and understand the discharge instructions provided? Yes  Medications obtained and verified? Yes  Other? No  Any new allergies since your discharge? No  Dietary orders reviewed? No Do you have support at home? Yes   Functional Questionnaire: (I = Independent and D = Dependent) ADLs: I  Bathing/Dressing- I  Meal Prep- I  Eating- I  Maintaining continence- I  Transferring/Ambulation- I  Managing Meds- I   Follow up appointments reviewed:  PCP Hospital f/u appt confirmed? No   Specialist Hospital f/u appt confirmed? Yes  Scheduled to see [facility]  on 01/25/2021 @ 9:35am. Visit with Dr. Mathis Fare on 02/28/2021 Are transportation  arrangements needed? No  If their condition worsens, is the pt aware to call PCP or go to the Emergency Dept.? Yes Was the patient provided with contact information for the PCP's office or ED? Yes Was to pt encouraged to call back with questions or concerns? Yes

## 2021-01-20 NOTE — Telephone Encounter (Signed)
Elevated BP alert received from [employer] of 168/82. Called pt; VM is full. MyChart message sent.

## 2021-01-21 NOTE — Telephone Encounter (Signed)
Called pt re: previous elevated BP reading. Pt stated that she checked BP this morning just after taking Nifedipine and had result 158/90.  She denies H/A or visual disturbances. Pt was asked if she could come to office today for BP check however she does not have a ride. I then asked pt to check BP again later today after sitting for 10-15 minutes. She should continue to do this daily and record results to [facility] app. I advised that if her BP continues to be elevated, she may receive a call with instructions such as medication change or need to go to hospital. Pt voiced understanding and had no questions.

## 2021-01-21 NOTE — Telephone Encounter (Signed)
Can we check on this patient? I am concerned that her BP was 168/82. She may need a BP check in office before the holiday. Please let me know. Thanks!   -CMA

## 2021-01-22 ENCOUNTER — Telehealth: Payer: Self-pay | Admitting: Obstetrics and Gynecology

## 2021-01-22 NOTE — Telephone Encounter (Signed)
Called by [employer] for critical value.   Veronica Townsend is a 1/1 s/p SVD on 12/18. Her course was complicated by [facility]. She was discharged home on Procardia 30 daily and Lasix. She is taking both of these medications as prescribed.   When she took her blood pressure earlier today it was 159/97. She then forgot to recheck it until midnight. [employer] called at 3am with 170s/80s. The patient had just put the baby down. She denies any HA/BV. We discussed since asymptomatic, we would do an increase in her Procardia to 60 mg daily and then check her blood pressure following this.   Reviewed concerning s/sx I.e. persistently elevated blood pressures, HA/BV and if these develop to come immediately to the MAU.

## 2021-01-25 ENCOUNTER — Ambulatory Visit (INDEPENDENT_AMBULATORY_CARE_PROVIDER_SITE_OTHER): Payer: Medicaid Other

## 2021-01-25 ENCOUNTER — Encounter: Payer: Self-pay | Admitting: Family Medicine

## 2021-01-25 ENCOUNTER — Other Ambulatory Visit: Payer: Self-pay

## 2021-01-25 VITALS — BP 129/68 | HR 86 | Wt 249.6 lb

## 2021-01-25 DIAGNOSIS — Z0130 Encounter for examination of blood pressure without abnormal findings: Secondary | ICD-10-CM

## 2021-01-25 MED ORDER — LISINOPRIL 10 MG PO TABS: 10.0000 mg | ORAL_TABLET | Freq: Every day | ORAL | 0 refills | Status: DC

## 2021-01-25 NOTE — Progress Notes (Signed)
Blood Pressure Check Visit  Veronica Townsend is here for blood pressure check following vaginal delivery on 01/16/21. History of chronic hypertension. Currently taking Nifedipine 60 mg daily; has taken this for past 4 days. BP today is 129/68. Patient reports headache currently. States she has had multiple headaches following discharge that improve with Tylenol of ibuprofen. Pt has not taken any medication for headache today. Encouraged pt to try Tylenol for headache. Denies any other symptoms of hypertension. Per chart review, pt is documenting BP in [employer] and has had multiple elevated BP in the past week. Reviewed how to check BP with patient. Patient is unsure if she is using the correct size cuff. Reviewed with Crissie Reese, MD who recommends patient return for visit ASAP with home blood pressure cuff so that it can be checked for accuracy. Provider recommends patient remain on Nifedipine 60 mg until next visit. Patient scheduled for visit on 01/27/21.  Marjo Bicker, RN 01/25/2021  3:10 PM

## 2021-01-26 NOTE — Progress Notes (Signed)
Chart reviewed for nurse visit. Agree with plan of care.   Venora Maples, MD 01/26/21 8:09 AM

## 2021-01-27 ENCOUNTER — Other Ambulatory Visit: Payer: Self-pay

## 2021-01-27 ENCOUNTER — Ambulatory Visit (INDEPENDENT_AMBULATORY_CARE_PROVIDER_SITE_OTHER): Payer: Medicaid Other

## 2021-01-27 VITALS — BP 131/68 | HR 89 | Wt 249.8 lb

## 2021-01-27 DIAGNOSIS — Z0130 Encounter for examination of blood pressure without abnormal findings: Secondary | ICD-10-CM

## 2021-01-27 NOTE — Progress Notes (Signed)
Pt here today for BP check.  Pt was asked to bring in BP cuff from home.  BP taken with home cuff 141/80 LA.  Pt reports slight headaches- Tylenol is effective, denies visual disturbances.   Pt states that she took Procardia 30 mg around 1100.   BP recheck RA 131/68.  Pt encouraged to continue to take medication as prescribed, monitor for sx's of elevated blood pressure, and to contact the office with concerns.  Pt verbalized understanding.    Addison Naegeli, RN  01/28/21

## 2021-01-28 ENCOUNTER — Telehealth (HOSPITAL_COMMUNITY): Payer: Self-pay

## 2021-01-28 NOTE — Telephone Encounter (Signed)
No answer. Mailbox is full.  Marcelino Duster [facility] 01/28/2021,0925

## 2021-02-01 ENCOUNTER — Other Ambulatory Visit: Payer: Self-pay

## 2021-02-01 DIAGNOSIS — O10919 Unspecified pre-existing hypertension complicating pregnancy, unspecified trimester: Secondary | ICD-10-CM

## 2021-02-01 MED ORDER — NIFEDIPINE ER 60 MG PO TB24: 60.0000 mg | ORAL_TABLET | Freq: Every day | ORAL | 5 refills | Status: AC

## 2021-02-01 NOTE — Progress Notes (Signed)
Procardia 60 mg daily prescription sent to patient's preferred pharmacy per Dione Plover, MD. 5 refills given. Pt to follow up at [facility] appt.

## 2021-02-28 ENCOUNTER — Other Ambulatory Visit: Payer: Self-pay

## 2021-02-28 ENCOUNTER — Encounter: Payer: Self-pay | Admitting: Family Medicine

## 2021-02-28 ENCOUNTER — Ambulatory Visit (INDEPENDENT_AMBULATORY_CARE_PROVIDER_SITE_OTHER): Payer: Medicaid Other | Admitting: Family Medicine

## 2021-02-28 DIAGNOSIS — I10 Essential (primary) hypertension: Secondary | ICD-10-CM

## 2021-02-28 NOTE — Progress Notes (Signed)
Post Partum Visit Note  Veronica Townsend is a 26 y.o. G96P1001 female who presents for a postpartum visit. She is 6 weeks postpartum following a normal spontaneous vaginal delivery.  I have fully reviewed the prenatal and intrapartum course. The delivery was at 39.2 gestational weeks.  Anesthesia: epidural. Postpartum course has been good overall. Baby is doing well. Baby is feeding by breast. Bleeding staining only. Bowel function is normal. Bladder function is normal. Patient is not sexually active. Contraception method is Nexplanon. Postpartum depression screening: negative.   Edinburgh Postnatal Depression Scale - 02/28/21 0933       Edinburgh Postnatal Depression Scale:  In the Past 7 Days   I have been able to laugh and see the funny side of things. 0    I have looked forward with enjoyment to things. 0    I have blamed myself unnecessarily when things went wrong. 0    I have been anxious or worried for no good reason. 0    I have felt scared or panicky for no good reason. 0    Things have been getting on top of me. 0    I have been so unhappy that I have had difficulty sleeping. 0    I have felt sad or miserable. 0    I have been so unhappy that I have been crying. 0    The thought of harming myself has occurred to me. 0    Edinburgh Postnatal Depression Scale Total 0             Health Maintenance Due  Topic Date Due   HPV VACCINES (1 - 2-dose series) Never done   TETANUS/TDAP  Never done   The following portions of the patient's history were reviewed and updated as appropriate: allergies, current medications, past family history, past medical history, past social history, past surgical history, and problem list.  Review of Systems Pertinent items noted in HPI and remainder of comprehensive ROS otherwise negative.  Objective:  BP 140/78    Pulse (!) 59    Wt 249 lb 4.8 oz (113.1 kg)    LMP 03/08/2020 Comment: was spotting in march for 3 days   Breastfeeding Yes    BMI  36.82 kg/m    General:  Alert, cooperative, no distress  Lungs: Normal work of breathing on room air   Heart:  Normal rate   Abdomen: Soft, nontender, gravid    GU exam: Deferred   Extremities: No LE edema noted       Assessment:   1. Encounter for postpartum visit Progressing well postpartum, normal exam today. Breast feeding well. Spotting occasionally, no issues with bleeding. Nexplanon in place and satisfied with this method for birth control. Normal bowel and bladder function. Negative depression screen. Up to date on pap smear. Recommended follow up in 1 year as needed for routine gynecologic exam, sooner as needed.   2. Chronic hypertension No longer taking Procardia. No symptoms of pre-E or issues with this. Mild range BP today. Discussed need to further address this with PCP for ongoing management. Patient plans to establish with PCP soon. Discussed lifestyle changes including elements of DASH diet and regular exercise that can help with BP. Strict return precautions reviewed with patient and she voiced understanding. Further management per PCP.   Plan:   Essential components of care per ACOG recommendations:  1.  Mood and well being: Patient with negative depression screening today. Reviewed local resources for support.  -  Patient tobacco use? No.   - Hx of drug use? No.    2. Infant care and feeding:  -Patient currently breastmilk feeding? Yes. Discussed returning to work and pumping.  -Social determinants of health (SDOH) reviewed in EPIC. No concerns.   3. Sexuality, contraception and birth spacing - Patient does not want a pregnancy in the next year.  - Reviewed forms of contraception in tiered fashion.  Nexplanon in place and satisfied with this method.  - Discussed birth spacing of 18 months.  4. Sleep and fatigue -Encouraged family/partner/community support of 4 hrs of uninterrupted sleep to help with mood and fatigue.   5. Physical Recovery  - Discussed  patients delivery and complications. She describes her labor as good. - Patient had a vaginal, no problems at delivery. Patient had a 1st degree perineal laceration. Perineal healing reviewed. Patient expressed understanding. - Patient has urinary incontinence? No. - Patient is safe to resume physical and sexual activity.  6.  Health Maintenance - HM due items addressed - Yes.  - Last pap smear  Diagnosis  Date Value Ref Range Status  07/06/2020   Final   - Negative for intraepithelial lesion or malignancy (NILM)   -Breast Cancer screening indicated? No.    7. Chronic Disease/Pregnancy Condition follow up: Hypertension. Advised to establish with PCP for further ongoing management.  - PCP follow up  Worthy Rancher, MD Center for [facility], [facility]

## 2021-08-23 DIAGNOSIS — F99 Mental disorder, not otherwise specified: Secondary | ICD-10-CM | POA: Diagnosis not present

## 2021-08-29 DIAGNOSIS — F99 Mental disorder, not otherwise specified: Secondary | ICD-10-CM | POA: Diagnosis not present

## 2021-09-05 DIAGNOSIS — F99 Mental disorder, not otherwise specified: Secondary | ICD-10-CM | POA: Diagnosis not present

## 2021-09-12 DIAGNOSIS — F99 Mental disorder, not otherwise specified: Secondary | ICD-10-CM | POA: Diagnosis not present

## 2021-09-21 DIAGNOSIS — N939 Abnormal uterine and vaginal bleeding, unspecified: Secondary | ICD-10-CM | POA: Diagnosis not present

## 2021-09-21 DIAGNOSIS — Z20822 Contact with and (suspected) exposure to covid-19: Secondary | ICD-10-CM | POA: Diagnosis not present

## 2021-09-21 DIAGNOSIS — J029 Acute pharyngitis, unspecified: Secondary | ICD-10-CM | POA: Diagnosis not present

## 2021-09-21 DIAGNOSIS — Z3202 Encounter for pregnancy test, result negative: Secondary | ICD-10-CM | POA: Diagnosis not present

## 2021-09-21 DIAGNOSIS — R059 Cough, unspecified: Secondary | ICD-10-CM | POA: Diagnosis not present

## 2021-09-22 DIAGNOSIS — F99 Mental disorder, not otherwise specified: Secondary | ICD-10-CM | POA: Diagnosis not present

## 2021-09-26 DIAGNOSIS — F99 Mental disorder, not otherwise specified: Secondary | ICD-10-CM | POA: Diagnosis not present

## 2021-10-04 DIAGNOSIS — F99 Mental disorder, not otherwise specified: Secondary | ICD-10-CM | POA: Diagnosis not present

## 2021-10-10 DIAGNOSIS — F99 Mental disorder, not otherwise specified: Secondary | ICD-10-CM | POA: Diagnosis not present

## 2021-10-17 DIAGNOSIS — F332 Major depressive disorder, recurrent severe without psychotic features: Secondary | ICD-10-CM | POA: Diagnosis not present

## 2021-10-25 DIAGNOSIS — F332 Major depressive disorder, recurrent severe without psychotic features: Secondary | ICD-10-CM | POA: Diagnosis not present

## 2021-10-31 DIAGNOSIS — F332 Major depressive disorder, recurrent severe without psychotic features: Secondary | ICD-10-CM | POA: Diagnosis not present

## 2021-11-02 DIAGNOSIS — Z113 Encounter for screening for infections with a predominantly sexual mode of transmission: Secondary | ICD-10-CM | POA: Diagnosis not present

## 2021-11-02 DIAGNOSIS — Z114 Encounter for screening for human immunodeficiency virus [HIV]: Secondary | ICD-10-CM | POA: Diagnosis not present

## 2021-11-02 DIAGNOSIS — N760 Acute vaginitis: Secondary | ICD-10-CM | POA: Diagnosis not present

## 2021-11-07 DIAGNOSIS — F332 Major depressive disorder, recurrent severe without psychotic features: Secondary | ICD-10-CM | POA: Diagnosis not present

## 2021-11-14 DIAGNOSIS — F332 Major depressive disorder, recurrent severe without psychotic features: Secondary | ICD-10-CM | POA: Diagnosis not present

## 2021-11-21 DIAGNOSIS — F332 Major depressive disorder, recurrent severe without psychotic features: Secondary | ICD-10-CM | POA: Diagnosis not present

## 2021-12-05 DIAGNOSIS — F332 Major depressive disorder, recurrent severe without psychotic features: Secondary | ICD-10-CM | POA: Diagnosis not present

## 2021-12-07 DIAGNOSIS — J020 Streptococcal pharyngitis: Secondary | ICD-10-CM | POA: Diagnosis not present

## 2021-12-07 DIAGNOSIS — J029 Acute pharyngitis, unspecified: Secondary | ICD-10-CM | POA: Diagnosis not present

## 2021-12-12 DIAGNOSIS — F332 Major depressive disorder, recurrent severe without psychotic features: Secondary | ICD-10-CM | POA: Diagnosis not present

## 2022-01-02 DIAGNOSIS — F332 Major depressive disorder, recurrent severe without psychotic features: Secondary | ICD-10-CM | POA: Diagnosis not present

## 2022-01-10 DIAGNOSIS — F332 Major depressive disorder, recurrent severe without psychotic features: Secondary | ICD-10-CM | POA: Diagnosis not present

## 2022-01-13 DIAGNOSIS — Z7689 Persons encountering health services in other specified circumstances: Secondary | ICD-10-CM | POA: Diagnosis not present

## 2022-01-13 DIAGNOSIS — R030 Elevated blood-pressure reading, without diagnosis of hypertension: Secondary | ICD-10-CM | POA: Diagnosis not present

## 2022-01-13 DIAGNOSIS — E569 Vitamin deficiency, unspecified: Secondary | ICD-10-CM | POA: Diagnosis not present

## 2022-01-13 DIAGNOSIS — N898 Other specified noninflammatory disorders of vagina: Secondary | ICD-10-CM | POA: Diagnosis not present

## 2022-01-13 DIAGNOSIS — N926 Irregular menstruation, unspecified: Secondary | ICD-10-CM | POA: Diagnosis not present

## 2022-01-13 DIAGNOSIS — Z1329 Encounter for screening for other suspected endocrine disorder: Secondary | ICD-10-CM | POA: Diagnosis not present

## 2022-04-12 ENCOUNTER — Ambulatory Visit (INDEPENDENT_AMBULATORY_CARE_PROVIDER_SITE_OTHER): Payer: Medicaid Other | Admitting: Obstetrics and Gynecology

## 2022-04-12 ENCOUNTER — Ambulatory Visit (HOSPITAL_COMMUNITY)
Admission: RE | Admit: 2022-04-12 | Discharge: 2022-04-12 | Disposition: A | Discharge status: Home / Self Care | Payer: Medicaid Other | Source: Ambulatory Visit | Attending: Physician Assistant | Admitting: Physician Assistant

## 2022-04-12 ENCOUNTER — Encounter: Payer: Self-pay | Admitting: Obstetrics and Gynecology

## 2022-04-12 ENCOUNTER — Encounter (HOSPITAL_COMMUNITY): Payer: Self-pay

## 2022-04-12 ENCOUNTER — Other Ambulatory Visit (HOSPITAL_COMMUNITY)
Admission: RE | Admit: 2022-04-12 | Discharge: 2022-04-12 | Disposition: A | Discharge status: Home / Self Care | Payer: Medicaid Other | Source: Ambulatory Visit | Attending: Obstetrics and Gynecology | Admitting: Obstetrics and Gynecology

## 2022-04-12 ENCOUNTER — Other Ambulatory Visit: Payer: Self-pay

## 2022-04-12 VITALS — BP 144/93 | HR 61 | Wt 234.1 lb

## 2022-04-12 VITALS — BP 121/75 | HR 67 | Temp 98.2°F

## 2022-04-12 DIAGNOSIS — R0789 Other chest pain: Secondary | ICD-10-CM | POA: Insufficient documentation

## 2022-04-12 DIAGNOSIS — R102 Pelvic and perineal pain: Secondary | ICD-10-CM

## 2022-04-12 DIAGNOSIS — F32A Depression, unspecified: Secondary | ICD-10-CM | POA: Diagnosis not present

## 2022-04-12 DIAGNOSIS — R051 Acute cough: Secondary | ICD-10-CM | POA: Diagnosis present

## 2022-04-12 DIAGNOSIS — J069 Acute upper respiratory infection, unspecified: Secondary | ICD-10-CM | POA: Insufficient documentation

## 2022-04-12 DIAGNOSIS — U071 COVID-19: Secondary | ICD-10-CM | POA: Diagnosis not present

## 2022-04-12 LAB — POCT URINALYSIS DIP (DEVICE)
Bilirubin Urine: NEGATIVE
Glucose, UA: NEGATIVE mg/dL
Ketones, ur: NEGATIVE mg/dL
Nitrite: NEGATIVE
Protein, ur: NEGATIVE mg/dL
Specific Gravity, Urine: 1.025 (ref 1.005–1.030)
Urobilinogen, UA: 0.2 mg/dL (ref 0.0–1.0)
pH: 6 (ref 5.0–8.0)

## 2022-04-12 LAB — POCT PREGNANCY, URINE: Preg Test, Ur: NEGATIVE

## 2022-04-12 LAB — POCT RAPID STREP A, ED / UC: Streptococcus, Group A Screen (Direct): NEGATIVE

## 2022-04-12 MED ORDER — PREDNISONE 20 MG PO TABS: 40.0000 mg | ORAL_TABLET | Freq: Every day | ORAL | 0 refills | Status: AC

## 2022-04-12 MED ORDER — PROMETHAZINE-DM 6.25-15 MG/5ML PO SYRP: 5.0000 mL | ORAL_SOLUTION | Freq: Four times a day (QID) | ORAL | 0 refills | Status: DC | PRN

## 2022-04-12 MED ORDER — PREDNISONE 20 MG PO TABS: 40.0000 mg | ORAL_TABLET | Freq: Every day | ORAL | 0 refills | Status: DC

## 2022-04-12 NOTE — ED Triage Notes (Addendum)
Complains of coughing up red mucus, congestion, cough,headache, runny nose, and diarrhea  symptoms started 4 days ago.  Symptoms have worsened since onset.    Has tried tylenol, tylenol congestion medicine

## 2022-04-12 NOTE — Progress Notes (Signed)
GYNECOLOGY VISIT  Patient name: Veronica Townsend MRN [id number]  Date of birth: [birth year] Chief Complaint:   Pelvic Pain  History:  Veronica Townsend is a 27 y.o. G1P0 being seen today for abnormal vaginal discharge and pelvic pain.  Nexplanon in place since 2022 with monthly menses. Decebmer had dark bleeding/spotting with abdominal pain and pelvic pain, predominantly right sided. Pain has resolved. First week of January went back to red bleeding and having pain but hasn't had that pain since. Pain with masturbation and has avoided interoucrse since. No pain or period in period.   Reported hx of PP depression with persistent symptoms at this time.   Past Medical History:  Diagnosis Date   Hypertension    Medical history non-contributory     Past Surgical History:  Procedure Laterality Date   NO PAST SURGERIES      The following portions of the patient's history were reviewed and updated as appropriate: allergies, current medications, past family history, past medical history, past social history, past surgical history and problem list.   Health Maintenance:   Last pap     Component Value Date/Time   DIAGPAP  07/06/2020 1526    - Negative for intraepithelial lesion or malignancy (NILM)   Paint Negative 07/06/2020 1526   ADEQPAP  07/06/2020 1526    Satisfactory for evaluation; transformation zone component PRESENT.     Review of Systems:  Pertinent items are noted in HPI. Comprehensive review of systems was otherwise negative.   Objective:  Physical Exam BP (!) 156/88   Pulse 62   Wt 234 lb 1.6 oz (106.2 kg)   Breastfeeding No   BMI 34.57 kg/m    Physical Exam Vitals and nursing note reviewed. Exam conducted with a chaperone present.  Constitutional:      Appearance: Normal appearance.  HENT:     Head: Normocephalic and atraumatic.  Pulmonary:     Effort: Pulmonary effort is normal.     Breath sounds: Normal breath sounds.  Genitourinary:    General:  Normal vulva.     Exam position: Lithotomy position.     Vagina: Normal.     Cervix: Normal.     Uterus: Normal.      Adnexa: Right adnexa normal.     Comments: Normal appearing vulva Posterior vaginal wall nontender Right levator ani minimally tender Right ischiococcygeous nontender Right obturator internus nontender Left levator ani nontender Left ischioccocygeous nontender Left obturator internus nontender Uterine tenderness nontender No CMT   Skin:    General: Skin is warm and dry.  Neurological:     General: No focal deficit present.     Mental Status: She is alert.  Psychiatric:        Mood and Affect: Mood normal.        Behavior: Behavior normal.        Thought Content: Thought content normal.        Judgment: Judgment normal.        Assessment & Plan:   1. Pelvic pain Unclear etiology - possible brief/self resolved endometritis/PID, no evidence of active infection on exam today. Possible endometrial change with build up of bleeding/hematometra with self resolution of old blood over time, though source of outflow obstruction unclear. Will follow up urine culture and vaginitis swab. UPT negative today. If abnormal bleeding continues, will order pelvic US.   2. Depression, unspecified depression type Notable: elevated PHQ9 and GAD7 - referral to [facility].  - Ambulatory referral to [place]  Elevated BP in clinic today. Dx of HTN in prior pregnancy. Also quite anxious today. Recommend follow up with PCP   Darliss Cheney, MD [facility] for [place]

## 2022-04-12 NOTE — ED Provider Notes (Signed)
[place]    CSN: [id number] Arrival date & time: 04/12/22  1855      History   Chief Complaint Chief Complaint  Patient presents with   Cough    Coughing up red mucus CongestionHeadache Runny nose Diarrhea - Entered by patient   Appointment    7:00    HPI Chanita Hubby is a 27 y.o. female.   Patient presents today with a 4-day history of URI symptoms.  She reports cough, congestion, headache, sore throat, rhinorrhea.  Denies any fever, chest pain, shortness of breath, vomiting.  She reports that her son has been sick but does not know what with.  He does attend daycare.  She has had COVID several years ago.  She has not had COVID-19 vaccination.  She denies any recent antibiotics or steroids.  She has tried Benadryl and cough/cold congestion medicine without improvement of symptoms.  She is confident that she is not pregnant; went to a woman's health appointment today where they did a pregnancy test that was negative.  She denies any significant past medical history including allergies, asthma, COPD, smoking, diabetes, immunosuppression.    Past Medical History:  Diagnosis Date   Hypertension    Medical history non-contributory     Patient Active Problem List   Diagnosis Date Noted   Vaginal delivery 01/16/2021   Chronic hypertension during pregnancy, antepartum 01/14/2021   Group B Streptococcus carrier, +RV culture, currently pregnant 01/02/2021   Alpha thalassemia silent carrier 08/19/2020   Supervision of high risk pregnancy, antepartum 07/06/2020   Chronic hypertension affecting pregnancy 07/06/2020    Past Surgical History:  Procedure Laterality Date   NO PAST SURGERIES      OB History     Gravida  1   Para      Term      Preterm      AB      Living         SAB      IAB      Ectopic      Multiple      Live Births               Home Medications    Prior to Admission medications   Medication Sig Start Date End Date  Taking? Authorizing Provider  predniSONE (DELTASONE) 20 MG tablet Take 2 tablets (40 mg total) by mouth daily for 5 days. 04/12/22 04/17/22 Yes Kile Kabler K, PA-C  promethazine-dextromethorphan (PROMETHAZINE-DM) 6.25-15 MG/5ML syrup Take 5 mLs by mouth 4 (four) times daily as needed for cough. 04/12/22  Yes Sanoe Hazan, Derry Skill, PA-C  acetaminophen (TYLENOL) 500 MG tablet Take 2 tablets (1,000 mg total) by mouth every 8 (eight) hours as needed (pain). Patient not taking: Reported on 02/28/2021 01/17/21   Genia Del, MD  Blood Pressure Monitoring (BLOOD PRESSURE KIT) DEVI 1 Device by Does not apply route as needed. Patient not taking: Reported on 02/28/2021 07/06/20   Clarnce Flock, MD  NIFEdipine (ADALAT CC) 30 MG 24 hr tablet Take 1 tablet (30 mg total) by mouth daily. Patient not taking: Reported on 02/28/2021 01/18/21   Genia Del, MD  NIFEdipine (ADALAT CC) 60 MG 24 hr tablet Take 1 tablet (60 mg total) by mouth daily. Patient not taking: Reported on 02/28/2021 02/01/21   Clarnce Flock, MD  Prenatal Vit-Fe Fumarate-FA (MULTIVITAMIN-PRENATAL) 27-0.8 MG TABS tablet Take 1 tablet by mouth daily at 12 noon. Patient not taking: Reported on 04/12/2022  [provider]    Family History Family History  Problem Relation Age of Onset   Diabetes Mother    Hypertension Mother    Hypertension Father     Social History Social History   Tobacco Use   Smoking status: Never   Smokeless tobacco: Never  Vaping Use   Vaping Use: Never used  Substance Use Topics   Alcohol use: Not Currently    Comment: weekly   Drug use: Not Currently    Types: Marijuana     Allergies   Patient has no known allergies.   Review of Systems Review of Systems  Constitutional:  Positive for activity change. Negative for appetite change, fatigue and fever.  HENT:  Positive for congestion, sinus pressure and sore throat. Negative for sneezing.   Respiratory:  Positive for cough.  Negative for shortness of breath.   Cardiovascular:  Negative for chest pain.  Gastrointestinal:  Negative for abdominal pain, diarrhea, nausea and vomiting.  Neurological:  Negative for dizziness, light-headedness and headaches.     Physical Exam Triage Vital Signs ED Triage Vitals  Enc Vitals Group     BP 04/12/22 1939 121/75     Pulse Rate 04/12/22 1939 67     Resp --      Temp 04/12/22 1939 98.2 F (36.8 C)     Temp Source 04/12/22 1939 Oral     SpO2 04/12/22 1939 96 %     Weight --      Height --      Head Circumference --      Peak Flow --      Pain Score 04/12/22 1935 7     Pain Loc --      Pain Edu? --      Excl. in [place]? --    No data found.  Updated Vital Signs BP 121/75 (BP Location: Left Arm) Comment (BP Location): large cuff  Pulse 67   Temp 98.2 F (36.8 C) (Oral)   LMP 02/05/2022   SpO2 96%   Visual Acuity Right Eye Distance:   Left Eye Distance:   Bilateral Distance:    Right Eye Near:   Left Eye Near:    Bilateral Near:     Physical Exam Vitals reviewed.  Constitutional:      General: She is awake. She is not in acute distress.    Appearance: Normal appearance. She is well-developed. She is not ill-appearing.     Comments: Very pleasant female appears stated age in no acute distress sitting comfortably on exam room  HENT:     Head: Normocephalic and atraumatic.     Right Ear: Tympanic membrane, ear canal and external ear normal. Tympanic membrane is not erythematous or bulging.     Left Ear: Tympanic membrane, ear canal and external ear normal. Tympanic membrane is not erythematous or bulging.     Nose:     Right Sinus: Maxillary sinus tenderness present. No frontal sinus tenderness.     Left Sinus: Maxillary sinus tenderness present. No frontal sinus tenderness.     Mouth/Throat:     Pharynx: Uvula midline. Posterior oropharyngeal erythema present. No oropharyngeal exudate.     Tonsils: No tonsillar exudate or tonsillar abscesses. 2+ on the  right. 2+ on the left.  Cardiovascular:     Rate and Rhythm: Normal rate and regular rhythm.     Heart sounds: Normal heart sounds, S1 normal and S2 normal. No murmur heard. Pulmonary:     Effort: Pulmonary  effort is normal.     Breath sounds: Normal breath sounds. No wheezing, rhonchi or rales.     Comments: Clear to auscultation bilaterally Psychiatric:        Behavior: Behavior is cooperative.      UC Treatments / Results  Labs (all labs ordered are listed, but only abnormal results are displayed) Labs Reviewed  SARS CORONAVIRUS 2 (TAT 6-24 HRS)  CULTURE, GROUP A STREP [facility])  POCT RAPID STREP A, ED / UC    EKG   Radiology No results found.  Procedures Procedures (including critical care time)  Medications Ordered in UC Medications - No data to display  Initial Impression / Assessment and Plan / UC Course  I have reviewed the triage vital signs and the nursing notes.  Pertinent labs & imaging results that were available during my care of the patient were reviewed by me and considered in my medical decision making (see chart for details).     Patient is well-appearing, afebrile, nontoxic, nontachycardic.  Strep testing was obtained given severe sore throat which was negative.  Throat culture is pending we will defer antibiotics until culture results are available.  COVID testing was obtained.  Flu testing was deferred as she is afebrile and has been sick for more than a few days so outside the window of effectiveness for Tamiflu.  We discussed likely viral etiology of symptoms.  Will start prednisone burst with instructions to take NSAIDs with this medication and risk of GI bleeding.  Can use Promethazine DM for cough.  Discussed that this can be sedating and he is not to drive or drink alcohol taking it.  She can use over-the-counter medications for symptom management.  Discussed that if symptoms or not improving within a week she is to return for reevaluation.  If she  has any worsening symptoms she needs to be seen immediately.  Strict return precautions given.  Work excuse note provided.  Final Clinical Impressions(s) / UC Diagnoses   Final diagnoses:  Upper respiratory tract infection, unspecified type  Acute cough  Chest tightness     Discharge Instructions      Your strep is negative.  We will contact you if your COVID is positive.  Gargle with warm salt water.  Use over-the-counter medications including Tylenol, Mucinex, Flonase.  Take Promethazine DM for cough.  This will make you sleepy so do not drive or drink alcohol with taking it.  Make sure that you rest and drink plenty of fluid.  If your symptoms do not improve within a week please return for reevaluation.  If anything worsens any develop worsening cough, shortness of breath, fever not responding to medication, chest pain, nausea/vomiting interfering with oral intake you need to be seen immediately.     ED Prescriptions     Medication Sig Dispense Auth. Provider   predniSONE (DELTASONE) 20 MG tablet Take 2 tablets (40 mg total) by mouth daily for 5 days. 10 tablet Yessenia Maillet K, PA-C   promethazine-dextromethorphan (PROMETHAZINE-DM) 6.25-15 MG/5ML syrup Take 5 mLs by mouth 4 (four) times daily as needed for cough. 118 mL Kately Graffam K, PA-C      PDMP not reviewed this encounter.   Terrilee Croak, PA-C 04/12/22 2050

## 2022-04-12 NOTE — Discharge Instructions (Signed)
Your strep is negative.  We will contact you if your COVID is positive.  Gargle with warm salt water.  Use over-the-counter medications including Tylenol, Mucinex, Flonase.  Take Promethazine DM for cough.  This will make you sleepy so do not drive or drink alcohol with taking it.  Make sure that you rest and drink plenty of fluid.  If your symptoms do not improve within a week please return for reevaluation.  If anything worsens any develop worsening cough, shortness of breath, fever not responding to medication, chest pain, nausea/vomiting interfering with oral intake you need to be seen immediately.

## 2022-04-13 ENCOUNTER — Other Ambulatory Visit: Payer: Self-pay | Admitting: Obstetrics and Gynecology

## 2022-04-13 DIAGNOSIS — N760 Acute vaginitis: Secondary | ICD-10-CM

## 2022-04-13 LAB — CERVICOVAGINAL ANCILLARY ONLY
Bacterial Vaginitis (gardnerella): POSITIVE — AB
Candida Glabrata: NEGATIVE
Candida Vaginitis: NEGATIVE
Chlamydia: NEGATIVE
Comment: NEGATIVE
Comment: NEGATIVE
Comment: NEGATIVE
Comment: NEGATIVE
Comment: NEGATIVE
Comment: NORMAL
Neisseria Gonorrhea: POSITIVE — AB
Trichomonas: NEGATIVE

## 2022-04-13 LAB — SARS CORONAVIRUS 2 (TAT 6-24 HRS): SARS Coronavirus 2: POSITIVE — AB

## 2022-04-13 MED ORDER — METRONIDAZOLE 500 MG PO TABS
500.0000 mg | ORAL_TABLET | Freq: Two times a day (BID) | ORAL | 0 refills | Status: AC
Start: 2022-04-13 — End: 2022-04-20

## 2022-04-14 ENCOUNTER — Telehealth: Payer: Self-pay

## 2022-04-14 LAB — URINE CULTURE: Organism ID, Bacteria: NO GROWTH

## 2022-04-14 NOTE — BH Specialist Note (Signed)
Pt did not arrive to video visit and did not answer the phone; Left HIPPA-compliant message to call back Londynn Sonoda from [facility] at  [phone] (Maia Handa's office).  ?; left MyChart message for patient.  ? ?

## 2022-04-14 NOTE — Telephone Encounter (Addendum)
-----   Message from Darliss Cheney, MD sent at 04/13/2022  7:09 PM EDT ----- Notify pt of gonorrhea. RX's sent. Advised of need for partner [employer] and no unprotected sex for 7 days after TX. Will need   Called pt; VM left. MyChart message sent. Communicable disease report faxed to health department.

## 2022-04-15 LAB — CULTURE, GROUP A STREP (THRC)

## 2022-04-18 ENCOUNTER — Other Ambulatory Visit: Payer: Self-pay

## 2022-04-18 ENCOUNTER — Ambulatory Visit (INDEPENDENT_AMBULATORY_CARE_PROVIDER_SITE_OTHER): Payer: Medicaid Other

## 2022-04-18 VITALS — BP 131/61 | HR 77 | Wt 238.5 lb

## 2022-04-18 DIAGNOSIS — A549 Gonococcal infection, unspecified: Secondary | ICD-10-CM

## 2022-04-18 MED ORDER — CEFTRIAXONE SODIUM 500 MG IJ SOLR
500.0000 mg | Freq: Once | INTRAMUSCULAR | Status: AC
  Administered 2022-04-18: 500 mg via INTRAMUSCULAR

## 2022-04-18 NOTE — Progress Notes (Signed)
Veronica Townsend here for Rocephin  Injection.  Injection administered without complication. Patient will return in 3 months for test for reinfection.  [name], RN 04/18/2022  3:38 PM

## 2022-04-27 ENCOUNTER — Ambulatory Visit: Payer: Medicaid Other | Admitting: Clinical

## 2022-04-27 DIAGNOSIS — Z91199 Patient's noncompliance with other medical treatment and regimen due to unspecified reason: Secondary | ICD-10-CM

## 2022-07-19 ENCOUNTER — Other Ambulatory Visit: Payer: Self-pay

## 2022-07-19 ENCOUNTER — Ambulatory Visit (INDEPENDENT_AMBULATORY_CARE_PROVIDER_SITE_OTHER): Payer: Medicaid Other

## 2022-07-19 ENCOUNTER — Other Ambulatory Visit (HOSPITAL_COMMUNITY)
Admission: RE | Admit: 2022-07-19 | Discharge: 2022-07-19 | Disposition: A | Discharge status: Home / Self Care | Payer: Medicaid Other | Source: Ambulatory Visit | Attending: Family Medicine | Admitting: Family Medicine

## 2022-07-19 VITALS — BP 128/80 | HR 59 | Ht 69.0 in | Wt 244.3 lb

## 2022-07-19 DIAGNOSIS — Z202 Contact with and (suspected) exposure to infections with a predominantly sexual mode of transmission: Secondary | ICD-10-CM

## 2022-07-19 NOTE — Progress Notes (Signed)
Veronica Townsend is here for TOC. These symptoms have been present for . Patient reports she has tried the following to resolve symptoms: Rocephin Injection  Pertinent history: Pt advised no current symptoms  Plan of care: Results will be available in My Chart, if still abnormal will contact her with Tx plan.  Self swab instructions given and specimen obtained. Explained patient will be contacted with any abnormal results. Patient is not due for annual exam.  07/19/2022  9:43 AM

## 2022-07-20 LAB — CERVICOVAGINAL ANCILLARY ONLY
Bacterial Vaginitis (gardnerella): POSITIVE — AB
Candida Glabrata: NEGATIVE
Candida Vaginitis: NEGATIVE
Chlamydia: NEGATIVE
Comment: NEGATIVE
Comment: NEGATIVE
Comment: NEGATIVE
Comment: NEGATIVE
Comment: NEGATIVE
Comment: NORMAL
Neisseria Gonorrhea: NEGATIVE
Trichomonas: NEGATIVE

## 2022-07-21 ENCOUNTER — Other Ambulatory Visit: Payer: Self-pay | Admitting: Obstetrics and Gynecology

## 2022-07-21 DIAGNOSIS — N760 Acute vaginitis: Secondary | ICD-10-CM

## 2022-07-21 MED ORDER — METRONIDAZOLE 500 MG PO TABS: 500.0000 mg | ORAL_TABLET | Freq: Two times a day (BID) | ORAL | 0 refills | Status: AC

## 2022-08-17 ENCOUNTER — Ambulatory Visit (HOSPITAL_COMMUNITY)
Admission: RE | Admit: 2022-08-17 | Discharge: 2022-08-17 | Disposition: A | Discharge status: Home / Self Care | Payer: Medicaid Other | Source: Ambulatory Visit | Attending: Nurse Practitioner | Admitting: Nurse Practitioner

## 2022-08-17 ENCOUNTER — Encounter (HOSPITAL_COMMUNITY): Payer: Self-pay

## 2022-08-17 VITALS — BP 118/76 | HR 79 | Temp 98.3°F | Resp 18

## 2022-08-17 DIAGNOSIS — J069 Acute upper respiratory infection, unspecified: Secondary | ICD-10-CM | POA: Diagnosis not present

## 2022-08-17 DIAGNOSIS — Z1152 Encounter for screening for COVID-19: Secondary | ICD-10-CM

## 2022-08-17 MED ORDER — BENZONATATE 100 MG PO CAPS: 100.0000 mg | ORAL_CAPSULE | Freq: Three times a day (TID) | ORAL | 0 refills | Status: AC | PRN

## 2022-08-17 MED ORDER — PROMETHAZINE-DM 6.25-15 MG/5ML PO SYRP: 5.0000 mL | ORAL_SOLUTION | Freq: Every evening | ORAL | 0 refills | Status: AC | PRN

## 2022-08-17 NOTE — Discharge Instructions (Addendum)
You have a viral upper respiratory infection.  Symptoms should improve over the next week to 10 days.  If you develop chest pain or shortness of breath, go to the emergency room.  We have tested you today for COVID-19.  You will see the results in Mychart and we will call you with positive results.  Please stay home and isolate until you are aware of the results.    Some things that can make you feel better are: - Increased rest - Increasing fluid with water/sugar free electrolytes - Acetaminophen and ibuprofen as needed for fever/pain - Salt water gargling, chloraseptic spray and throat lozenges - OTC guaifenesin (Mucinex) 600 mg twice daily - Saline sinus flushes or a neti pot - Humidifying the air -Tessalon Perles every 8 hours as needed for dry cough and cough syrup for night time at needed for dry cough

## 2022-08-17 NOTE — ED Triage Notes (Signed)
Pt states she has been in contact with COVID positive people at work last week. She has had cough, fatigue, leg cramps x 2 days. She states she wants a COVID test because she has a 1 year at home.

## 2022-08-17 NOTE — ED Provider Notes (Signed)
[facility]    CSN: [id number] Arrival date & time: 08/17/22  1156      History   Chief Complaint Chief Complaint  Patient presents with   Cough    Came in contact with Covid positive people - Entered by patient   Fatigue   Leg Pain    HPI Veronica Townsend is a 27 y.o. female.   Patient presents today with 2-day history of bodyaches, congested cough, shortness of breath with activity, chest tightness, chest congestion, stuffy nose, sore throat that has not improved, headache, decreased appetite, and fatigue.  She denies known fevers, chills, chest pain, runny nose, postnasal drainage, ear pain, abdominal pain, nausea, vomiting, and diarrhea.  No loss of taste or smell.  Reports she has been in contact with coworkers who have tested positive for COVID-19.  Has taken ibuprofen which seems to help minimally.  Patient is confident she is not pregnant; has Nexplanon implant and has irregular periods.    Past Medical History:  Diagnosis Date   Hypertension    Medical history non-contributory     Patient Active Problem List   Diagnosis Date Noted   Vaginal delivery 01/16/2021   Chronic hypertension during pregnancy, antepartum 01/14/2021   Group B Streptococcus carrier, +RV culture, currently pregnant 01/02/2021   Alpha thalassemia silent carrier 08/19/2020   Supervision of high risk pregnancy, antepartum 07/06/2020   Chronic hypertension affecting pregnancy 07/06/2020    Past Surgical History:  Procedure Laterality Date   NO PAST SURGERIES      OB History     Gravida  1   Para      Term      Preterm      AB      Living         SAB      IAB      Ectopic      Multiple      Live Births               Home Medications    Prior to Admission medications   Medication Sig Start Date End Date Taking? Authorizing Provider  benzonatate (TESSALON) 100 MG capsule Take 1 capsule (100 mg total) by mouth 3 (three) times daily as needed for  cough. Do not take with alcohol or while driving or operating heavy machinery.  May cause drowsiness. 08/17/22  Yes Valentino Nose, NP  promethazine-dextromethorphan (PROMETHAZINE-DM) 6.25-15 MG/5ML syrup Take 5 mLs by mouth at bedtime as needed for cough. Do not take with alcohol or while driving or operating heavy machinery.  May cause drowsiness. 08/17/22  Yes Valentino Nose, NP  acetaminophen (TYLENOL) 500 MG tablet Take 2 tablets (1,000 mg total) by mouth every 8 (eight) hours as needed (pain). Patient not taking: Reported on 02/28/2021 01/17/21   Worthy Rancher, MD  Blood Pressure Monitoring (BLOOD PRESSURE KIT) DEVI 1 Device by Does not apply route as needed. Patient not taking: Reported on 02/28/2021 07/06/20   Venora Maples, MD  NIFEdipine (ADALAT CC) 30 MG 24 hr tablet Take 1 tablet (30 mg total) by mouth daily. Patient not taking: Reported on 02/28/2021 01/18/21   Worthy Rancher, MD  NIFEdipine (ADALAT CC) 60 MG 24 hr tablet Take 1 tablet (60 mg total) by mouth daily. Patient not taking: Reported on 02/28/2021 02/01/21   Venora Maples, MD  Prenatal Vit-Fe Fumarate-FA (MULTIVITAMIN-PRENATAL) 27-0.8 MG TABS tablet Take 1 tablet by mouth daily at 12 noon. Patient not  taking: Reported on 04/12/2022    [provider]    Family History Family History  Problem Relation Age of Onset   Diabetes Mother    Hypertension Mother    Hypertension Father     Social History Social History   Tobacco Use   Smoking status: Never   Smokeless tobacco: Never  Vaping Use   Vaping status: Never Used  Substance Use Topics   Alcohol use: Not Currently    Comment: weekly   Drug use: Not Currently    Types: Marijuana     Allergies   Patient has no known allergies.   Review of Systems Review of Systems Per HPI  Physical Exam Triage Vital Signs ED Triage Vitals [08/17/22 1212]  Encounter Vitals Group     BP 118/76     Systolic BP Percentile      Diastolic  BP Percentile      Pulse Rate 79     Resp 18     Temp 98.3 F (36.8 C)     Temp Source Oral     SpO2 96 %     Weight      Height      Head Circumference      Peak Flow      Pain Score 0     Pain Loc      Pain Education      Exclude from Growth Chart    No data found.  Updated Vital Signs BP 118/76 (BP Location: Right Arm)   Pulse 79   Temp 98.3 F (36.8 C) (Oral)   Resp 18   LMP 07/13/2022   SpO2 96%   Visual Acuity Right Eye Distance:   Left Eye Distance:   Bilateral Distance:    Right Eye Near:   Left Eye Near:    Bilateral Near:     Physical Exam Vitals and nursing note reviewed.  Constitutional:      General: She is not in acute distress.    Appearance: Normal appearance. She is not ill-appearing or toxic-appearing.  HENT:     Head: Normocephalic and atraumatic.     Right Ear: Tympanic membrane, ear canal and external ear normal.     Left Ear: Tympanic membrane, ear canal and external ear normal.     Nose: Congestion and rhinorrhea present.     Mouth/Throat:     Mouth: Mucous membranes are moist.     Pharynx: Oropharynx is clear. No oropharyngeal exudate or posterior oropharyngeal erythema.  Eyes:     General: No scleral icterus.    Extraocular Movements: Extraocular movements intact.  Cardiovascular:     Rate and Rhythm: Normal rate and regular rhythm.  Pulmonary:     Effort: Pulmonary effort is normal. No respiratory distress.     Breath sounds: Normal breath sounds. No wheezing, rhonchi or rales.  Abdominal:     General: Abdomen is flat. Bowel sounds are normal. There is no distension.     Palpations: Abdomen is soft.  Musculoskeletal:     Cervical back: Normal range of motion and neck supple.  Lymphadenopathy:     Cervical: No cervical adenopathy.  Skin:    General: Skin is warm and dry.     Coloration: Skin is not jaundiced or pale.     Findings: No erythema or rash.  Neurological:     Mental Status: She is alert and oriented to person,  place, and time.  Psychiatric:  Behavior: Behavior is cooperative.      UC Treatments / Results  Labs (all labs ordered are listed, but only abnormal results are displayed) Labs Reviewed  SARS CORONAVIRUS 2 (TAT 6-24 HRS)    EKG   Radiology No results found.  Procedures Procedures (including critical care time)  Medications Ordered in UC Medications - No data to display  Initial Impression / Assessment and Plan / UC Course  I have reviewed the triage vital signs and the nursing notes.  Pertinent labs & imaging results that were available during my care of the patient were reviewed by me and considered in my medical decision making (see chart for details).   Patient is well-appearing, normotensive, afebrile, not tachycardic, not tachypneic, oxygenating well on room air.    1. Viral URI with cough 2. Encounter for screening for COVID-19 Suspect viral etiology Vitals and exam today reassuring COVID-19 testing obtained Supportive care discussed ER and return precautions discussed Note given for work  The patient was given the opportunity to ask questions.  All questions answered to their satisfaction.  The patient is in agreement to this plan.   Final Clinical Impressions(s) / UC Diagnoses   Final diagnoses:  Viral URI with cough  Encounter for screening for COVID-19     Discharge Instructions      You have a viral upper respiratory infection.  Symptoms should improve over the next week to 10 days.  If you develop chest pain or shortness of breath, go to the emergency room.  We have tested you today for COVID-19.  You will see the results in Mychart and we will call you with positive results.  Please stay home and isolate until you are aware of the results.    Some things that can make you feel better are: - Increased rest - Increasing fluid with water/sugar free electrolytes - Acetaminophen and ibuprofen as needed for fever/pain - Salt water gargling,  chloraseptic spray and throat lozenges - OTC guaifenesin (Mucinex) 600 mg twice daily - Saline sinus flushes or a neti pot - Humidifying the air -Tessalon Perles every 8 hours as needed for dry cough and cough syrup for night time at needed for dry cough     ED Prescriptions     Medication Sig Dispense Auth. Provider   benzonatate (TESSALON) 100 MG capsule Take 1 capsule (100 mg total) by mouth 3 (three) times daily as needed for cough. Do not take with alcohol or while driving or operating heavy machinery.  May cause drowsiness. 21 capsule Cathlean Marseilles A, NP   promethazine-dextromethorphan (PROMETHAZINE-DM) 6.25-15 MG/5ML syrup Take 5 mLs by mouth at bedtime as needed for cough. Do not take with alcohol or while driving or operating heavy machinery.  May cause drowsiness. 118 mL Valentino Nose, NP      PDMP not reviewed this encounter.   Valentino Nose, NP 08/17/22 1352

## 2022-08-18 LAB — SARS CORONAVIRUS 2 (TAT 6-24 HRS): SARS Coronavirus 2: NEGATIVE

## 2022-11-13 IMAGING — US US OB < 14 WEEKS - US OB TV
1 series · 15 of 28 positions shown · non-contrast
Comparison: None.

CLINICAL DATA: Four days of vaginal spotting lower abdominal pain.
Positive urine pregnancy test however no quantitative beta HCG
performed at time of imaging.

EXAM:
OBSTETRIC <14 WK ULTRASOUND
TECHNIQUE: Transabdominal ultrasound was performed for evaluation of the
gestation as well as the maternal uterus and adnexal regions.

[Series 1: us ob < 14 weeks - us ob tv · 15 of 46 slices shown]
[im 1/46]
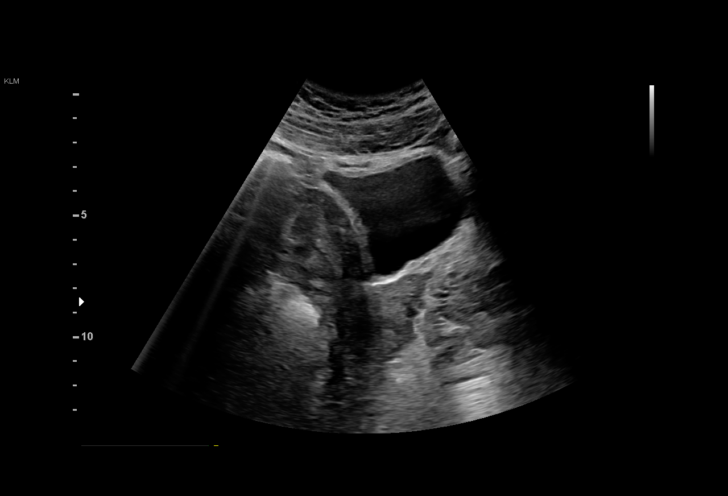
[im 4/46]
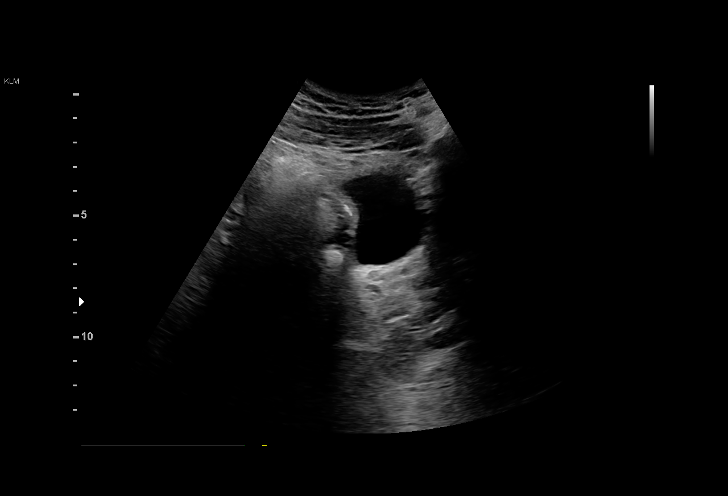
[im 7/46]
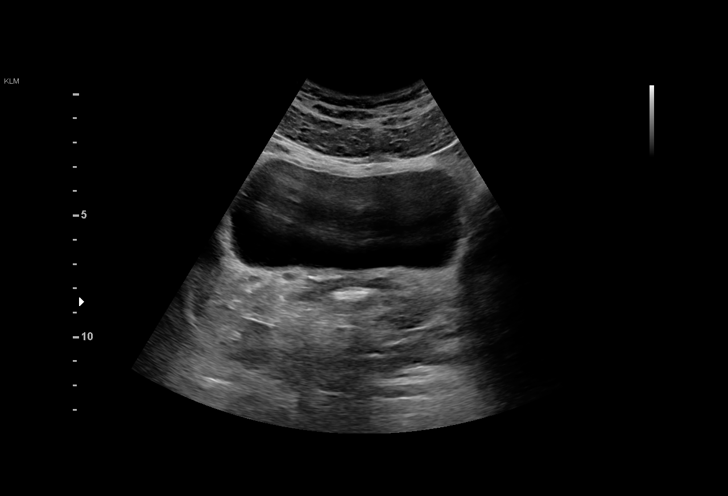
[im 11/46]
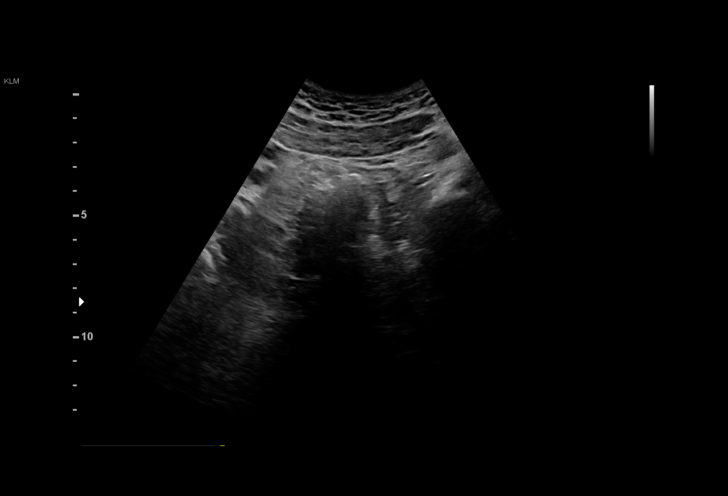
[im 14/46]
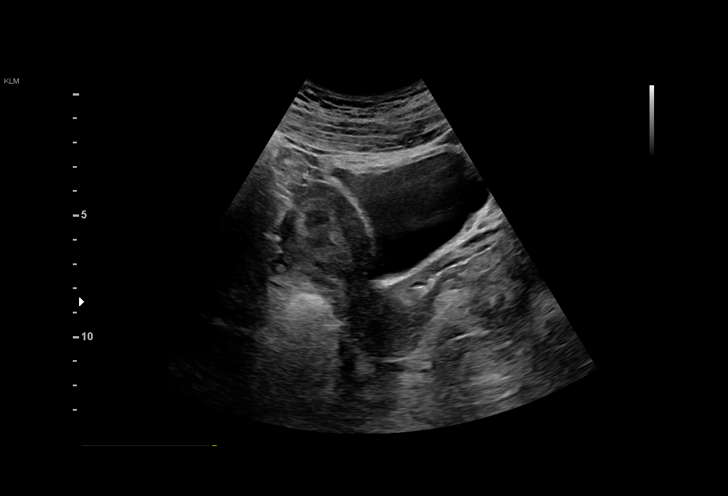
[im 17/46]
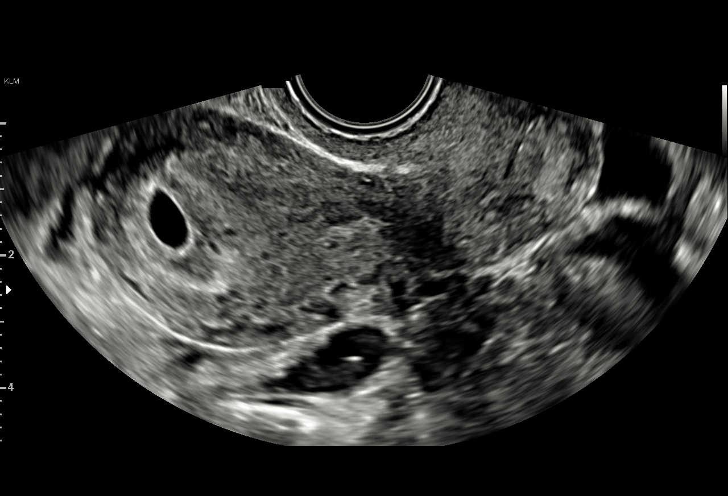
[im 21/46]
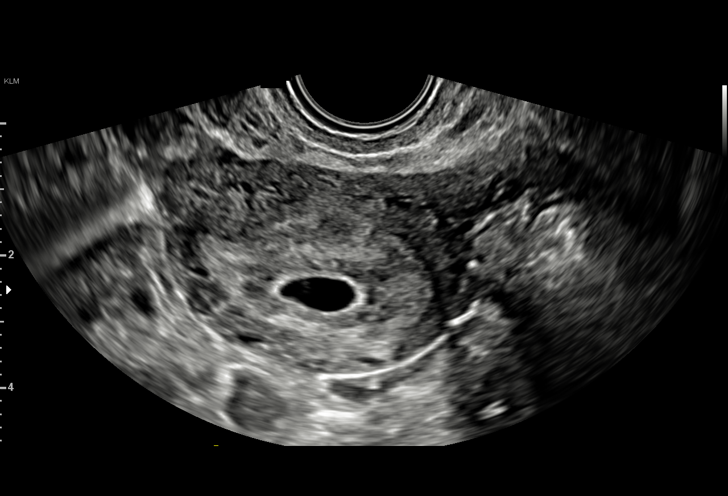
[im 24/46]
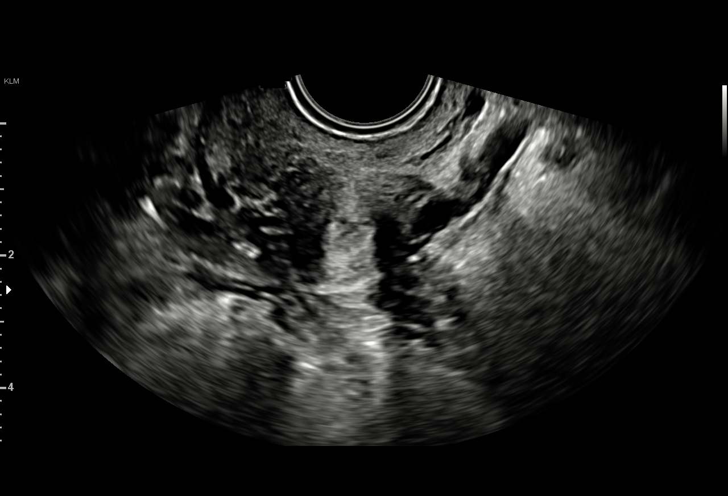
[im 26/46]
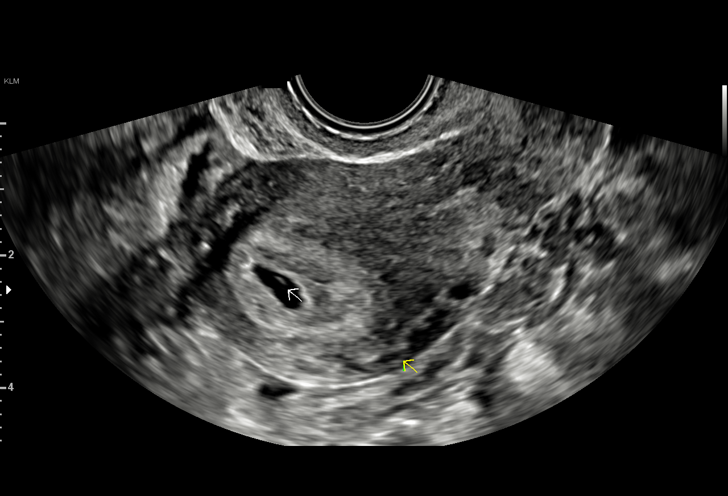
[im 29/46]
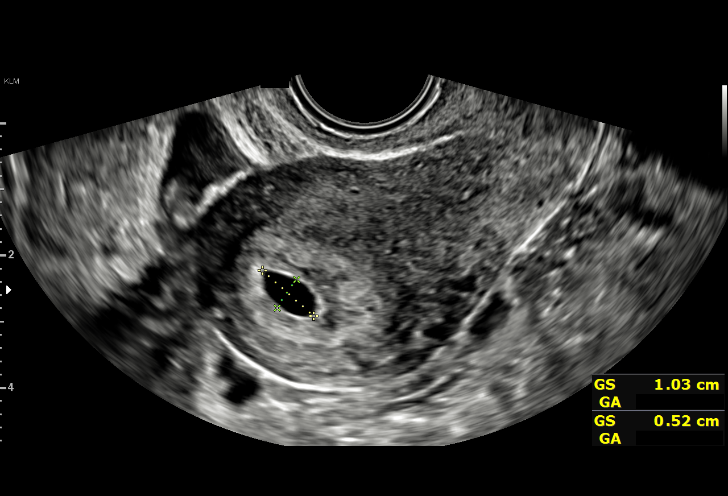
[im 32/46]
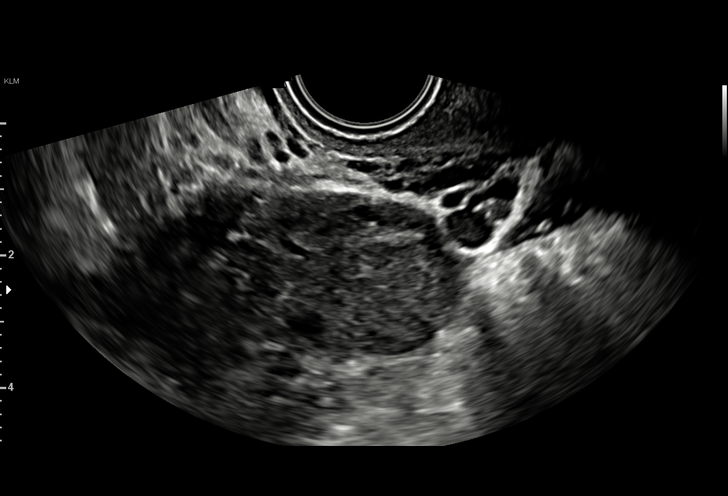
[im 36/46]
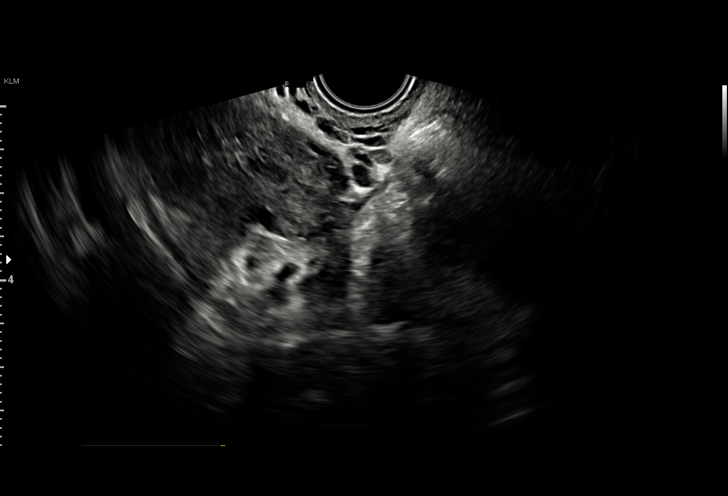
[im 39/46]
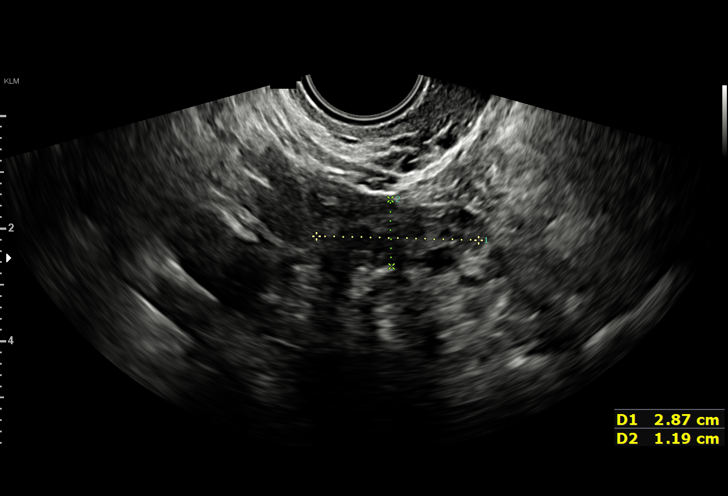
[im 42/46]
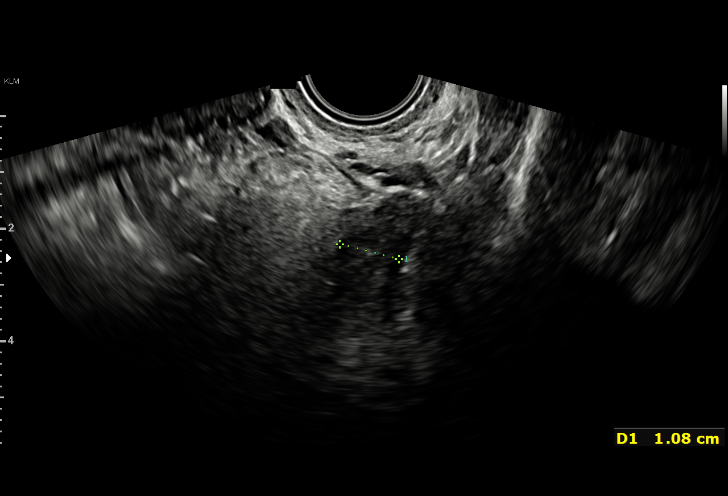
[im 46/46]
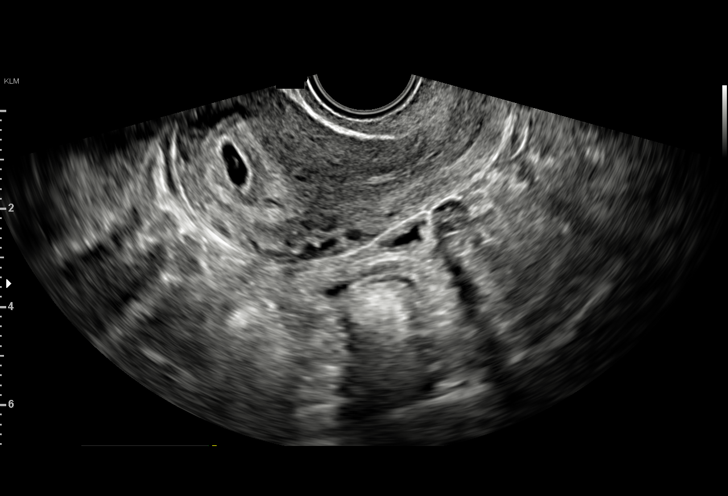

[15 of 28 positions shown; findings below may reference images not displayed]

FINDINGS: Intrauterine gestational sac: Single

Yolk sac:  Visualized.

Embryo:  Not Visualized.

Cardiac Activity: Not Visualized.

Heart Rate:  bpm

MSD:  9 mm   5 w   4 d

Subchorionic hemorrhage:  None visualized.

Maternal uterus/adnexae: Small corpus luteum in the right ovary
IMPRESSION: Single intrauterine pregnancy of uncertain viability, recommend
follow-up ultrasound in 14 days.

## 2022-12-11 DIAGNOSIS — R102 Pelvic and perineal pain: Secondary | ICD-10-CM | POA: Diagnosis not present

## 2022-12-11 DIAGNOSIS — R350 Frequency of micturition: Secondary | ICD-10-CM | POA: Diagnosis not present

## 2022-12-11 DIAGNOSIS — R051 Acute cough: Secondary | ICD-10-CM | POA: Diagnosis not present

## 2023-01-30 DIAGNOSIS — R059 Cough, unspecified: Secondary | ICD-10-CM | POA: Diagnosis not present

## 2023-01-30 DIAGNOSIS — R070 Pain in throat: Secondary | ICD-10-CM | POA: Diagnosis not present

## 2023-01-30 DIAGNOSIS — J209 Acute bronchitis, unspecified: Secondary | ICD-10-CM | POA: Diagnosis not present

## 2023-05-07 DIAGNOSIS — R079 Chest pain, unspecified: Secondary | ICD-10-CM | POA: Diagnosis not present

## 2023-05-07 DIAGNOSIS — R051 Acute cough: Secondary | ICD-10-CM | POA: Diagnosis not present

## 2023-05-11 DIAGNOSIS — N912 Amenorrhea, unspecified: Secondary | ICD-10-CM | POA: Diagnosis not present

## 2023-05-11 DIAGNOSIS — N760 Acute vaginitis: Secondary | ICD-10-CM | POA: Diagnosis not present

## 2023-05-11 DIAGNOSIS — Z3202 Encounter for pregnancy test, result negative: Secondary | ICD-10-CM | POA: Diagnosis not present

## 2023-05-15 DIAGNOSIS — R079 Chest pain, unspecified: Secondary | ICD-10-CM | POA: Diagnosis not present

## 2023-05-15 DIAGNOSIS — R001 Bradycardia, unspecified: Secondary | ICD-10-CM | POA: Diagnosis not present

## 2023-07-12 DIAGNOSIS — N898 Other specified noninflammatory disorders of vagina: Secondary | ICD-10-CM | POA: Diagnosis not present

## 2023-07-12 DIAGNOSIS — Z3049 Encounter for surveillance of other contraceptives: Secondary | ICD-10-CM | POA: Diagnosis not present

## 2023-07-12 DIAGNOSIS — R102 Pelvic and perineal pain: Secondary | ICD-10-CM | POA: Diagnosis not present

## 2023-07-12 DIAGNOSIS — Z7251 High risk heterosexual behavior: Secondary | ICD-10-CM | POA: Diagnosis not present

## 2023-07-12 DIAGNOSIS — Z3202 Encounter for pregnancy test, result negative: Secondary | ICD-10-CM | POA: Diagnosis not present

## 2023-10-13 DIAGNOSIS — J209 Acute bronchitis, unspecified: Secondary | ICD-10-CM | POA: Diagnosis not present

## 2023-10-13 DIAGNOSIS — R0981 Nasal congestion: Secondary | ICD-10-CM | POA: Diagnosis not present

## 2023-10-13 DIAGNOSIS — R070 Pain in throat: Secondary | ICD-10-CM | POA: Diagnosis not present

## 2023-10-13 DIAGNOSIS — R051 Acute cough: Secondary | ICD-10-CM | POA: Diagnosis not present

## 2023-10-13 DIAGNOSIS — J019 Acute sinusitis, unspecified: Secondary | ICD-10-CM | POA: Diagnosis not present

## 2023-10-22 ENCOUNTER — Ambulatory Visit (HOSPITAL_COMMUNITY)
Admission: RE | Admit: 2023-10-22 | Discharge: 2023-10-22 | Disposition: A | Discharge status: Home / Self Care | Payer: Self-pay | Source: Ambulatory Visit | Attending: Internal Medicine | Admitting: Internal Medicine

## 2023-10-22 ENCOUNTER — Encounter (HOSPITAL_COMMUNITY): Payer: Self-pay

## 2023-10-22 VITALS — BP 128/79 | HR 59 | Temp 98.3°F | Resp 17

## 2023-10-22 DIAGNOSIS — N760 Acute vaginitis: Secondary | ICD-10-CM | POA: Insufficient documentation

## 2023-10-22 DIAGNOSIS — Z113 Encounter for screening for infections with a predominantly sexual mode of transmission: Secondary | ICD-10-CM | POA: Insufficient documentation

## 2023-10-22 DIAGNOSIS — N898 Other specified noninflammatory disorders of vagina: Secondary | ICD-10-CM | POA: Diagnosis present

## 2023-10-22 DIAGNOSIS — Z3202 Encounter for pregnancy test, result negative: Secondary | ICD-10-CM | POA: Diagnosis not present

## 2023-10-22 LAB — HIV ANTIBODY (ROUTINE TESTING W REFLEX): HIV Screen 4th Generation wRfx: NONREACTIVE

## 2023-10-22 LAB — POCT URINALYSIS DIP (MANUAL ENTRY)
Bilirubin, UA: NEGATIVE
Blood, UA: NEGATIVE
Glucose, UA: NEGATIVE mg/dL
Ketones, POC UA: NEGATIVE mg/dL
Nitrite, UA: NEGATIVE
Protein Ur, POC: NEGATIVE mg/dL
Spec Grav, UA: 1.025 (ref 1.010–1.025)
Urobilinogen, UA: 1 U/dL
pH, UA: 6 (ref 5.0–8.0)

## 2023-10-22 LAB — POCT URINE PREGNANCY: Preg Test, Ur: NEGATIVE

## 2023-10-22 MED ORDER — FLUCONAZOLE 150 MG PO TABS: 150.0000 mg | ORAL_TABLET | ORAL | 0 refills | Status: AC

## 2023-10-22 NOTE — ED Triage Notes (Signed)
 Pt week having vaginal discharge that is white, dryness, burning that not relieved with OTC Monastat. Pt also reports LMP 8/19 and would like pregnancy test.

## 2023-10-22 NOTE — ED Provider Notes (Signed)
 [facility]    CSN: [id number] Arrival date & time: 10/22/23  9096      History   Chief Complaint Chief Complaint  Patient presents with   appt [id number]    HPI Veronica Townsend is a 28 y.o. female.   Veronica Townsend is a 28 y.o. female presenting for chief complaint of thick white vaginal discharge and vaginal irritation that started a few days ago.  Reports vaginal itching/irritation without vaginal odor.  She is sexually active and has had 4 partners within the last month.  2 partners have been unprotected.  All partners are female.  She would like a pregnancy test today.  Last menstrual cycle was September 18, 2023, menses are normally regular.  Denies known exposures to STDs.  She is requesting STD testing today as well. She presumes symptoms may be due to vaginal yeast infection and has been using Monistat  with minimal relief over-the-counter. Denies urinary symptoms, flank pain, fever, chills, dyspareunia, abdominal pain, dizziness, and recent antibiotic/steroid use.     Past Medical History:  Diagnosis Date   Hypertension    Medical history non-contributory     Patient Active Problem List   Diagnosis Date Noted   Vaginal delivery 01/16/2021   Chronic hypertension during pregnancy, antepartum 01/14/2021   Group B Streptococcus carrier, +RV culture, currently pregnant 01/02/2021   Alpha thalassemia silent carrier 08/19/2020   Supervision of high risk pregnancy, antepartum 07/06/2020   Chronic hypertension affecting pregnancy 07/06/2020    Past Surgical History:  Procedure Laterality Date   NO PAST SURGERIES      OB History     Gravida  1   Para      Term      Preterm      AB      Living         SAB      IAB      Ectopic      Multiple      Live Births               Home Medications    Prior to Admission medications   Medication Sig Start Date End Date Taking? Authorizing Provider  fluconazole  (DIFLUCAN ) 150 MG tablet Take 1 tablet  (150 mg total) by mouth every 3 (three) days. 10/22/23  Yes Enedelia Dorna HERO, FNP  acetaminophen  (TYLENOL ) 500 MG tablet Take 2 tablets (1,000 mg total) by mouth every 8 (eight) hours as needed (pain). Patient not taking: Reported on 02/28/2021 01/17/21   Clem Tawni HERO, MD  benzonatate  (TESSALON ) 100 MG capsule Take 1 capsule (100 mg total) by mouth 3 (three) times daily as needed for cough. Do not take with alcohol or while driving or operating heavy machinery.  May cause drowsiness. 08/17/22   Chandra Harlene LABOR, NP  Blood Pressure Monitoring (BLOOD PRESSURE KIT) DEVI 1 Device by Does not apply route as needed. Patient not taking: Reported on 02/28/2021 07/06/20   Lola Donnice HERO, MD  NIFEdipine  (ADALAT  CC) 30 MG 24 hr tablet Take 1 tablet (30 mg total) by mouth daily. Patient not taking: Reported on 02/28/2021 01/18/21   Clem Tawni HERO, MD  NIFEdipine  (ADALAT  CC) 60 MG 24 hr tablet Take 1 tablet (60 mg total) by mouth daily. Patient not taking: Reported on 02/28/2021 02/01/21   Lola Donnice HERO, MD  Prenatal Vit-Fe Fumarate-FA (MULTIVITAMIN-PRENATAL) 27-0.8 MG TABS tablet Take 1 tablet by mouth daily at 12 noon. Patient not taking: Reported on 04/12/2022  [provider]  promethazine -dextromethorphan (PROMETHAZINE -DM) 6.25-15 MG/5ML syrup Take 5 mLs by mouth at bedtime as needed for cough. Do not take with alcohol or while driving or operating heavy machinery.  May cause drowsiness. 08/17/22   Chandra Harlene LABOR, NP    Family History Family History  Problem Relation Age of Onset   Diabetes Mother    Hypertension Mother    Hypertension Father     Social History Social History   Tobacco Use   Smoking status: Never   Smokeless tobacco: Never  Vaping Use   Vaping status: Never Used  Substance Use Topics   Alcohol use: Not Currently    Comment: weekly   Drug use: Not Currently    Types: Marijuana     Allergies   Patient has no known allergies.   Review  of Systems Review of Systems Per HPI  Physical Exam Triage Vital Signs ED Triage Vitals [10/22/23 0953]  Encounter Vitals Group     BP 128/79     Girls Systolic BP Percentile      Girls Diastolic BP Percentile      Boys Systolic BP Percentile      Boys Diastolic BP Percentile      Pulse Rate (!) 59     Resp 17     Temp 98.3 F (36.8 C)     Temp Source Oral     SpO2 97 %     Weight      Height      Head Circumference      Peak Flow      Pain Score 5     Pain Loc      Pain Education      Exclude from Growth Chart    No data found.  Updated Vital Signs BP 128/79 (BP Location: Right Arm)   Pulse (!) 59   Temp 98.3 F (36.8 C) (Oral)   Resp 17   LMP 09/18/2023 (Exact Date)   SpO2 97%   Visual Acuity Right Eye Distance:   Left Eye Distance:   Bilateral Distance:    Right Eye Near:   Left Eye Near:    Bilateral Near:     Physical Exam Vitals and nursing note reviewed.  Constitutional:      Appearance: She is not ill-appearing or toxic-appearing.  HENT:     Head: Normocephalic and atraumatic.     Right Ear: Hearing and external ear normal.     Left Ear: Hearing and external ear normal.     Nose: Nose normal.     Mouth/Throat:     Lips: Pink.  Eyes:     General: Lids are normal. Vision grossly intact. Gaze aligned appropriately.     Extraocular Movements: Extraocular movements intact.     Conjunctiva/sclera: Conjunctivae normal.  Pulmonary:     Effort: Pulmonary effort is normal.  Abdominal:     General: Bowel sounds are normal.     Palpations: Abdomen is soft.     Tenderness: There is no abdominal tenderness. There is no right CVA tenderness, left CVA tenderness or guarding.  Musculoskeletal:     Cervical back: Neck supple.  Skin:    General: Skin is warm and dry.     Capillary Refill: Capillary refill takes less than 2 seconds.     Findings: No rash.  Neurological:     General: No focal deficit present.     Mental Status: She is alert and oriented  to person, place, and  time. Mental status is at baseline.     Cranial Nerves: No dysarthria or facial asymmetry.  Psychiatric:        Mood and Affect: Mood normal.        Speech: Speech normal.        Behavior: Behavior normal.        Thought Content: Thought content normal.        Judgment: Judgment normal.      UC Treatments / Results  Labs (all labs ordered are listed, but only abnormal results are displayed) Labs Reviewed  POCT URINALYSIS DIP (MANUAL ENTRY) - Abnormal; Notable for the following components:      Result Value   Leukocytes, UA Trace (*)    All other components within normal limits  POCT URINE PREGNANCY - Normal  RPR  HIV ANTIBODY (ROUTINE TESTING W REFLEX)  CERVICOVAGINAL ANCILLARY ONLY    EKG   Radiology No results found.  Procedures Procedures (including critical care time)  Medications Ordered in UC Medications - No data to display  Initial Impression / Assessment and Plan / UC Course  I have reviewed the triage vital signs and the nursing notes.  Pertinent labs & imaging results that were available during my care of the patient were reviewed by me and considered in my medical decision making (see chart for details).   1.  Screening for STD, acute vaginitis, vaginal discharge, negative pregnancy test High clinical suspicion for acute yeast vaginitis, therefore will treat with diflucan  empirically.  Vaginal swab pending, will treat for other positive infections per protocol when labs result. HIV/RPR pending. Urine pregnancy test is negative. Discussed tips to prevent recurrent yeast vaginitis infections. Safe sex precautions discussed.    Counseled patient on potential for adverse effects with medications prescribed/recommended today, strict ER and return-to-clinic precautions discussed, patient verbalized understanding.    Final Clinical Impressions(s) / UC Diagnoses   Final diagnoses:  Screening for STD (sexually transmitted disease)   Acute vaginitis  Vaginal discharge  Negative pregnancy test     Discharge Instructions      I suspect your vaginal symptoms are due to vaginal yeast infection.   Diflucan  has been sent to treat this.  Take 1 pill today, then another pill in 3 days if you are still having vaginal symptoms.   Your swab has been sent for testing, staff will call you in 2-3 days if you test positive for any other infections and will provide treatment at that time.  Wear cotton underwear and avoid wearing tight clothing to prevent vaginal yeast infections in the future.   Return to urgent care as needed and follow-up with your primary care provider for further evaluation and management of your symptoms..  I hope you feel better!       ED Prescriptions     Medication Sig Dispense Auth. Provider   fluconazole  (DIFLUCAN ) 150 MG tablet Take 1 tablet (150 mg total) by mouth every 3 (three) days. 2 tablet Enedelia Dorna HERO, FNP      PDMP not reviewed this encounter.   Enedelia Dorna HERO, [place] 10/22/23 1038

## 2023-10-22 NOTE — Discharge Instructions (Signed)
 I suspect your vaginal symptoms are due to vaginal yeast infection.   Diflucan  has been sent to treat this.  Take 1 pill today, then another pill in 3 days if you are still having vaginal symptoms.   Your swab has been sent for testing, staff will call you in 2-3 days if you test positive for any other infections and will provide treatment at that time.  Wear cotton underwear and avoid wearing tight clothing to prevent vaginal yeast infections in the future.   Return to urgent care as needed and follow-up with your primary care provider for further evaluation and management of your symptoms..  I hope you feel better!

## 2023-10-23 LAB — CERVICOVAGINAL ANCILLARY ONLY
Bacterial Vaginitis (gardnerella): NEGATIVE
Candida Glabrata: NEGATIVE
Candida Vaginitis: NEGATIVE
Chlamydia: NEGATIVE
Comment: NEGATIVE
Comment: NEGATIVE
Comment: NEGATIVE
Comment: NEGATIVE
Comment: NEGATIVE
Comment: NORMAL
Neisseria Gonorrhea: NEGATIVE
Trichomonas: NEGATIVE

## 2023-10-23 LAB — RPR: RPR Ser Ql: NONREACTIVE

## 2024-01-03 DIAGNOSIS — R051 Acute cough: Secondary | ICD-10-CM | POA: Diagnosis not present

## 2024-01-03 DIAGNOSIS — J029 Acute pharyngitis, unspecified: Secondary | ICD-10-CM | POA: Diagnosis not present
